# Patient Record
Sex: Male | Born: 1990 | Race: Black or African American | Hispanic: No | Marital: Married | Smoking: Former smoker
Health system: Southern US, Community
[De-identification: ages and names within clinical notes are randomized; demographics above are authoritative.]

## PROBLEM LIST (undated history)

## (undated) DIAGNOSIS — I7771 Dissection of carotid artery: Secondary | ICD-10-CM

## (undated) DIAGNOSIS — I675 Moyamoya disease: Secondary | ICD-10-CM

## (undated) DIAGNOSIS — I639 Cerebral infarction, unspecified: Secondary | ICD-10-CM

## (undated) HISTORY — DX: Dissection of carotid artery: I77.71

## (undated) HISTORY — DX: Cerebral infarction, unspecified: I63.9

## (undated) HISTORY — DX: Moyamoya disease: I67.5

---

## 2013-04-21 DIAGNOSIS — G4730 Sleep apnea, unspecified: Secondary | ICD-10-CM | POA: Insufficient documentation

## 2013-04-21 DIAGNOSIS — A6000 Herpesviral infection of urogenital system, unspecified: Secondary | ICD-10-CM | POA: Insufficient documentation

## 2013-04-21 DIAGNOSIS — Z8619 Personal history of other infectious and parasitic diseases: Secondary | ICD-10-CM | POA: Insufficient documentation

## 2013-12-31 DIAGNOSIS — N342 Other urethritis: Secondary | ICD-10-CM | POA: Insufficient documentation

## 2013-12-31 DIAGNOSIS — R300 Dysuria: Secondary | ICD-10-CM | POA: Insufficient documentation

## 2016-11-28 ENCOUNTER — Encounter (HOSPITAL_COMMUNITY)
Admission: EM | Disposition: A | Discharge status: Home / Self Care | Payer: Self-pay | Source: Home / Self Care | Attending: Internal Medicine

## 2016-11-28 ENCOUNTER — Emergency Department (HOSPITAL_COMMUNITY): Payer: BLUE CROSS/BLUE SHIELD

## 2016-11-28 ENCOUNTER — Emergency Department (HOSPITAL_COMMUNITY): Payer: BLUE CROSS/BLUE SHIELD | Admitting: Certified Registered Nurse Anesthetist

## 2016-11-28 ENCOUNTER — Inpatient Hospital Stay (HOSPITAL_COMMUNITY)
Admission: EM | Admit: 2016-11-28 | Discharge: 2016-12-01 | DRG: 064 | Disposition: A | Discharge status: Home / Self Care | Payer: BLUE CROSS/BLUE SHIELD | Attending: Internal Medicine | Admitting: Internal Medicine

## 2016-11-28 ENCOUNTER — Encounter (HOSPITAL_COMMUNITY): Payer: Self-pay | Admitting: Emergency Medicine

## 2016-11-28 DIAGNOSIS — F17210 Nicotine dependence, cigarettes, uncomplicated: Secondary | ICD-10-CM | POA: Diagnosis present

## 2016-11-28 DIAGNOSIS — I63231 Cerebral infarction due to unspecified occlusion or stenosis of right carotid arteries: Secondary | ICD-10-CM

## 2016-11-28 DIAGNOSIS — R29810 Facial weakness: Secondary | ICD-10-CM | POA: Diagnosis present

## 2016-11-28 DIAGNOSIS — E669 Obesity, unspecified: Secondary | ICD-10-CM | POA: Diagnosis present

## 2016-11-28 DIAGNOSIS — R7303 Prediabetes: Secondary | ICD-10-CM

## 2016-11-28 DIAGNOSIS — R739 Hyperglycemia, unspecified: Secondary | ICD-10-CM | POA: Diagnosis present

## 2016-11-28 DIAGNOSIS — I7771 Dissection of carotid artery: Secondary | ICD-10-CM | POA: Diagnosis present

## 2016-11-28 DIAGNOSIS — I675 Moyamoya disease: Secondary | ICD-10-CM | POA: Diagnosis present

## 2016-11-28 DIAGNOSIS — E559 Vitamin D deficiency, unspecified: Secondary | ICD-10-CM | POA: Insufficient documentation

## 2016-11-28 DIAGNOSIS — Z6833 Body mass index (BMI) 33.0-33.9, adult: Secondary | ICD-10-CM

## 2016-11-28 DIAGNOSIS — E785 Hyperlipidemia, unspecified: Secondary | ICD-10-CM | POA: Diagnosis present

## 2016-11-28 DIAGNOSIS — I2510 Atherosclerotic heart disease of native coronary artery without angina pectoris: Secondary | ICD-10-CM | POA: Diagnosis present

## 2016-11-28 DIAGNOSIS — I639 Cerebral infarction, unspecified: Secondary | ICD-10-CM | POA: Diagnosis present

## 2016-11-28 DIAGNOSIS — I63019 Cerebral infarction due to thrombosis of unspecified vertebral artery: Secondary | ICD-10-CM

## 2016-11-28 DIAGNOSIS — R4701 Aphasia: Secondary | ICD-10-CM | POA: Diagnosis present

## 2016-11-28 DIAGNOSIS — E7801 Familial hypercholesterolemia: Secondary | ICD-10-CM

## 2016-11-28 DIAGNOSIS — R29705 NIHSS score 5: Secondary | ICD-10-CM | POA: Diagnosis present

## 2016-11-28 HISTORY — PX: IR ANGIO VERTEBRAL SEL VERTEBRAL BILAT MOD SED: IMG5369

## 2016-11-28 HISTORY — PX: IR ANGIO INTRA EXTRACRAN SEL COM CAROTID INNOMINATE BILAT MOD SED: IMG5360

## 2016-11-28 HISTORY — PX: RADIOLOGY WITH ANESTHESIA: SHX6223

## 2016-11-28 LAB — APTT: aPTT: 29 seconds (ref 24–36)

## 2016-11-28 LAB — CBC
HEMATOCRIT: 45.4 % (ref 39.0–52.0)
HEMOGLOBIN: 14.9 g/dL (ref 13.0–17.0)
MCH: 30.5 pg (ref 26.0–34.0)
MCHC: 32.8 g/dL (ref 30.0–36.0)
MCV: 93 fL (ref 78.0–100.0)
Platelets: 224 10*3/uL (ref 150–400)
RBC: 4.88 MIL/uL (ref 4.22–5.81)
RDW: 14.2 % (ref 11.5–15.5)
WBC: 6.7 10*3/uL (ref 4.0–10.5)

## 2016-11-28 LAB — URINALYSIS, ROUTINE W REFLEX MICROSCOPIC
Bilirubin Urine: NEGATIVE
GLUCOSE, UA: NEGATIVE mg/dL
Hgb urine dipstick: NEGATIVE
KETONES UR: NEGATIVE mg/dL
LEUKOCYTES UA: NEGATIVE
Nitrite: NEGATIVE
PROTEIN: NEGATIVE mg/dL
Specific Gravity, Urine: 1.026 (ref 1.005–1.030)
pH: 5 (ref 5.0–8.0)

## 2016-11-28 LAB — DIFFERENTIAL
BASOS ABS: 0 10*3/uL (ref 0.0–0.1)
Basophils Relative: 0 %
Eosinophils Absolute: 0 10*3/uL (ref 0.0–0.7)
Eosinophils Relative: 0 %
LYMPHS ABS: 1.9 10*3/uL (ref 0.7–4.0)
LYMPHS PCT: 28 %
Monocytes Absolute: 0.4 10*3/uL (ref 0.1–1.0)
Monocytes Relative: 6 %
NEUTROS ABS: 4.4 10*3/uL (ref 1.7–7.7)
NEUTROS PCT: 66 %

## 2016-11-28 LAB — I-STAT CHEM 8, ED
BUN: 16 mg/dL (ref 6–20)
CHLORIDE: 106 mmol/L (ref 101–111)
Calcium, Ion: 1.14 mmol/L — ABNORMAL LOW (ref 1.15–1.40)
Creatinine, Ser: 1 mg/dL (ref 0.61–1.24)
Glucose, Bld: 93 mg/dL (ref 65–99)
HEMATOCRIT: 45 % (ref 39.0–52.0)
HEMOGLOBIN: 15.3 g/dL (ref 13.0–17.0)
Potassium: 4 mmol/L (ref 3.5–5.1)
Sodium: 140 mmol/L (ref 135–145)
TCO2: 28 mmol/L (ref 0–100)

## 2016-11-28 LAB — COMPREHENSIVE METABOLIC PANEL
ALBUMIN: 4.2 g/dL (ref 3.5–5.0)
ALK PHOS: 71 U/L (ref 38–126)
ALT: 33 U/L (ref 17–63)
AST: 19 U/L (ref 15–41)
Anion gap: 7 (ref 5–15)
BILIRUBIN TOTAL: 0.7 mg/dL (ref 0.3–1.2)
BUN: 12 mg/dL (ref 6–20)
CALCIUM: 9.3 mg/dL (ref 8.9–10.3)
CO2: 26 mmol/L (ref 22–32)
CREATININE: 1.07 mg/dL (ref 0.61–1.24)
Chloride: 108 mmol/L (ref 101–111)
GFR calc Af Amer: >60 mL/min (ref >60)
GFR calc non Af Amer: >60 mL/min (ref >60)
GLUCOSE: 100 mg/dL — AB (ref 65–99)
POTASSIUM: 3.9 mmol/L (ref 3.5–5.1)
Sodium: 141 mmol/L (ref 135–145)
TOTAL PROTEIN: 6.6 g/dL (ref 6.5–8.1)

## 2016-11-28 LAB — RAPID URINE DRUG SCREEN, HOSP PERFORMED
Amphetamines: NOT DETECTED
BARBITURATES: NOT DETECTED
Benzodiazepines: NOT DETECTED
COCAINE: NOT DETECTED
Opiates: NOT DETECTED
TETRAHYDROCANNABINOL: POSITIVE — AB

## 2016-11-28 LAB — C-REACTIVE PROTEIN

## 2016-11-28 LAB — RAPID HIV SCREEN (HIV 1/2 AB+AG)
HIV 1/2 ANTIBODIES: NONREACTIVE
HIV-1 P24 Antigen - HIV24: NONREACTIVE

## 2016-11-28 LAB — I-STAT TROPONIN, ED: Troponin i, poc: 0 ng/mL (ref 0.00–0.08)

## 2016-11-28 LAB — PROTIME-INR
INR: 1
PROTHROMBIN TIME: 13.2 s (ref 11.4–15.2)

## 2016-11-28 LAB — SEDIMENTATION RATE: SED RATE: 4 mm/h (ref 0–16)

## 2016-11-28 LAB — ETHANOL

## 2016-11-28 LAB — CBG MONITORING, ED: Glucose-Capillary: 89 mg/dL (ref 65–99)

## 2016-11-28 SURGERY — RADIOLOGY WITH ANESTHESIA
Anesthesia: Monitor Anesthesia Care

## 2016-11-28 MED ORDER — FENTANYL CITRATE (PF) 100 MCG/2ML IJ SOLN
INTRAMUSCULAR | Status: DC | PRN
  Administered 2016-11-28: 50 ug via INTRAVENOUS

## 2016-11-28 MED ORDER — ASPIRIN EC 81 MG PO TBEC
81.0000 mg | DELAYED_RELEASE_TABLET | Freq: Every day | ORAL | Status: DC
  Administered 2016-11-29 – 2016-12-01 (×3): 81 mg via ORAL
  Filled 2016-11-28 (×3): qty 1

## 2016-11-28 MED ORDER — LIDOCAINE HCL (PF) 1 % IJ SOLN
INTRAMUSCULAR | Status: AC
  Filled 2016-11-28: qty 30

## 2016-11-28 MED ORDER — ASPIRIN 325 MG PO TABS: 325.0000 mg | ORAL_TABLET | Freq: Once | ORAL | Status: DC

## 2016-11-28 MED ORDER — PROPOFOL 500 MG/50ML IV EMUL
INTRAVENOUS | Status: DC | PRN
  Administered 2016-11-28: 10 ug/kg/min via INTRAVENOUS

## 2016-11-28 MED ORDER — SODIUM CHLORIDE 0.9 % IV SOLN
INTRAVENOUS | Status: DC
  Administered 2016-11-28: 19:00:00 via INTRAVENOUS

## 2016-11-28 MED ORDER — LACTATED RINGERS IV SOLN
INTRAVENOUS | Status: DC | PRN
  Administered 2016-11-28: 14:00:00 via INTRAVENOUS

## 2016-11-28 MED ORDER — FENTANYL CITRATE (PF) 100 MCG/2ML IJ SOLN
INTRAMUSCULAR | Status: AC
  Filled 2016-11-28: qty 2

## 2016-11-28 MED ORDER — CLOPIDOGREL BISULFATE 75 MG PO TABS
75.0000 mg | ORAL_TABLET | Freq: Every day | ORAL | Status: DC
  Administered 2016-11-29 – 2016-12-01 (×3): 75 mg via ORAL
  Filled 2016-11-28 (×3): qty 1

## 2016-11-28 MED ORDER — HEPARIN SODIUM (PORCINE) 1000 UNIT/ML IJ SOLN
INTRAMUSCULAR | Status: DC | PRN
  Administered 2016-11-28: 1000 [IU] via INTRAVENOUS

## 2016-11-28 MED ORDER — MIDAZOLAM HCL 5 MG/5ML IJ SOLN
INTRAMUSCULAR | Status: DC | PRN
  Administered 2016-11-28: 2 mg via INTRAVENOUS

## 2016-11-28 MED ORDER — STROKE: EARLY STAGES OF RECOVERY BOOK
Freq: Once | Status: AC
  Administered 2016-11-30: 06:00:00

## 2016-11-28 MED ORDER — ACETAMINOPHEN 325 MG PO TABS
650.0000 mg | ORAL_TABLET | Freq: Four times a day (QID) | ORAL | Status: DC | PRN
  Administered 2016-11-28 – 2016-12-01 (×6): 650 mg via ORAL
  Filled 2016-11-28 (×6): qty 2

## 2016-11-28 MED ORDER — IOPAMIDOL (ISOVUE-300) INJECTION 61%
INTRAVENOUS | Status: AC
  Administered 2016-11-28: 85 mL
  Filled 2016-11-28: qty 300

## 2016-11-28 MED ORDER — IOPAMIDOL (ISOVUE-370) INJECTION 76%
INTRAVENOUS | Status: AC
  Administered 2016-11-28: 50 mL
  Filled 2016-11-28: qty 50

## 2016-11-28 MED ORDER — MIDAZOLAM HCL 2 MG/2ML IJ SOLN
INTRAMUSCULAR | Status: AC
  Filled 2016-11-28: qty 2

## 2016-11-28 MED ORDER — LIDOCAINE HCL (PF) 1 % IJ SOLN
INTRAMUSCULAR | Status: DC | PRN
  Administered 2016-11-28: 10 mL

## 2016-11-28 MED ORDER — FENTANYL CITRATE (PF) 100 MCG/2ML IJ SOLN
25.0000 ug | Freq: Once | INTRAMUSCULAR | Status: AC
Start: 2016-11-28 — End: 2016-11-28
  Administered 2016-11-28: 25 ug via INTRAVENOUS

## 2016-11-28 NOTE — Anesthesia Postprocedure Evaluation (Signed)
Anesthesia Post Note  Patient: [place]  Procedure(s) Performed: Procedure(s) (LRB): RADIOLOGY WITH ANESTHESIA (N/A)     Patient location during evaluation: PACU Anesthesia Type: MAC Level of consciousness: awake and alert Pain management: pain level controlled Vital Signs Assessment: post-procedure vital signs reviewed and stable Respiratory status: spontaneous breathing, nonlabored ventilation, respiratory function stable and patient connected to nasal cannula oxygen Cardiovascular status: stable and blood pressure returned to baseline Anesthetic complications: no    Last Vitals:  Vitals:   11/28/16 1555 11/28/16 1615  BP: 122/69 112/68  Pulse: (!) 45 (!) 48  Resp: (!) 22 16  Temp:      Last Pain:  Vitals:   11/28/16 1615  TempSrc:   PainSc: 0-No pain                 Effie Berkshire

## 2016-11-28 NOTE — H&P (Signed)
Date: 11/28/2016               Patient Name:  Manuel Boyer MRN: [id number]  DOB: Nov 15, 1990 Age / Sex: 26 y.o., male   PCP: Patient, No Pcp Per         Medical Service: Internal Medicine Teaching Service         Attending Physician: Dr. Lucious Groves, DO    First Contact: Dr. Hetty Ely Pager: [phone]  Second Contact: Dr. Juleen China Pager: [phone]       After Hours (After 5p/  First Contact Pager: [phone]  weekends / holidays): Second Contact Pager: [phone]   Chief Complaint: Pain in right arm and left hand  History of Present Illness: PMH tobacco use, hyperglycemia, genital herpes, and vitamin D deficiency who presents with pain in his right arm and left hand, slurred speech and right hand weakness which began last Wednesday and worsened over the last 2 days. He denies any other weakness, changes in vision or hearing, numbness or tingling, neck pain or stiffness. Yesterday he also developed a headache over the left side of his head which is not associated with photophobia or phonophobia and did not relieve with aleve. Never had any symptoms like this before.  In the ED he was found to have heart rate in the 50s, RR 20s, SBP 110s, SpO2 98% on room air. Labs revealed a glucose100, white count 6.7, glucose 100, Hgb 15, point-of-care troponin negative, blood alcohol level undetected, UDS positive for THC, urinalysis negative for leukocytes, nitrites or bacteria.   Meds:  He's not on any medications at home.   Allergies: Allergies as of 11/28/2016  . (No Known Allergies)   History reviewed. No pertinent past medical history.  Family History: Brother had a brain hemorrhage and a young age   Social History: He has smoked 3 PPD for the past 10 years. Denies alcohol or cocaine use. Does report he uses marijuana. He works as a Probation officer. Reports he's been otherwise healthy not taken any chronic medications.   Review of Systems: A complete ROS was negative except as per  HPI.  Physical Exam: Blood pressure 108/71, pulse (!) 44, temperature 97 F (36.1 C), resp. rate 20, height '5\' 10"'  (1.778 m), weight 236 lb (107 kg), SpO2 98 %. Physical Exam  Constitutional: He is oriented to person, place, and time and well-developed, well-nourished, and in no distress. No distress.  HENT:  Head: Normocephalic and atraumatic.  Eyes: Conjunctivae and EOM are normal. Pupils are equal, round, and reactive to light. Right eye exhibits no discharge. Left eye exhibits no discharge. No scleral icterus.  Cardiovascular: Normal rate and regular rhythm.   No murmur heard. Pulmonary/Chest: Effort normal. No respiratory distress.  No wheezes or rales over anterior lung fields  Abdominal: Soft. Bowel sounds are normal. He exhibits no distension. There is no tenderness. There is no guarding.  Neurological: He is alert and oriented to person, place, and time. A cranial nerve deficit is present.  Right-sided facial droop, word finding difficulties. Right hand strength 1 out of 5. Bilateral upper arm strength equal and 5 /5. Left hand strength 5/ 5. Bilateral Doppler showed extremities 5/5. Finger to nose limited by right hand weakness but appeared normal  Skin: Skin is warm and dry. He is not diaphoretic.   EKG: Personal review the EKG shows normal sinus rhythm with T-wave inversion in V1  CXR: Personal review of the chest x-ray shows no acute cardiopulmonary  disease.  CT angio head and neck echodensity in the left frontal lobe consistent with subacute infarct. 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 Occlusion of the supraclinoid segment of the internal carotid with multiple collaterals. Diffuse irregularity of anterior, middle, and posterior cerebral artery branches.   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 He was taken for conventional angiography of the internal carotid occlusion, unfortunately this showed there was no ability to do intervention at this time. Lipid panel in care everywhere from 04/2015 shows triglycerides 406, total cholesterol 183, and HDL 31.  Follow-up hemoglobin A1c and fasting morning glucose for risk factor modification  F/u sickle cell panel  F/U CRP, ESR, ANA, C and P ANCA PT OT SLP consult  -neuro checks and tele monitoring   History of vitamin D deficiency Vitamin D level in November 2016 was 12.7 he is not currently on supplementation for this and likely still has a low vitamin D level. - Follow-up vitamin D  Dispo: Admit patient to Inpatient with expected length of stay greater than 2 midnights.  Signed: Ledell Noss, MD 11/28/2016, 5:46 PM  Pager: [phone]

## 2016-11-28 NOTE — Progress Notes (Signed)
Paged Katrinka BlazingSmith, [place] [facility] he states "neurology is not admitting this patient the hospitalists will be admitting and Dr Silverio LayYao is looking for someone who would be willing to take the patient. The patient will have to go back to the ED." Informed anesthesia and RN in procedure room. [facility] for assistance with admitting pt.

## 2016-11-28 NOTE — ED Triage Notes (Signed)
Per ems pt from home, pt LSn Thursday, c/o slurred speech and R arm weekness. Pt is AAOX4, very slow to get words out, R arm is tremoring. Pt c/o R arm pain. VSS

## 2016-11-28 NOTE — Progress Notes (Signed)
Removed pt wedding band from left ring finger, placed in biohazard bag and gave to pt wife.

## 2016-11-28 NOTE — ED Notes (Signed)
Neurology at bedside.

## 2016-11-28 NOTE — Code Documentation (Signed)
26 y.o. male with no significant PMHx who states about 5 days ago started to have some confusion. This confusion progressed over the following days and began to have expressive aphasia along with dysarthria that began at 1900 on 6/25. [facility] evaluation of these symptoms. EDP assessed patient and noted patient to have right sided facial droop and right sided weakness. Code stroke not called d/t patient being out of the window. CTH and CTA ordered.   On assessment, patient with mild dysarthria, expressive aphasia, RUE weakness and slight right facial droop. NIHSS 5. See EMR for NIHSS.  Radiologist reporting patient likely had ICA dissection with complete M1 occlusion causing subacute L frontal stroke on CT currently. Neurologist consulted, who reviewed CT and CTA. Neurologist stated to call code stroke despite patient being out of the window for intervention. Neurologist states that occlusion may be new and he is a candidate for IR intervention potentially. Neurologist and IR MD discussed case and decided to proceed with a diagnostic cath angio. Consent obtained from wife and patient taken to IR by ED RN and SRN.

## 2016-11-28 NOTE — Anesthesia Procedure Notes (Signed)
Procedure Name: MAC Date/Time: 11/28/2016 1:45 PM Performed by: Verdie Drown Pre-anesthesia Checklist: Patient identified, Emergency Drugs available and Suction available Patient Re-evaluated:Patient Re-evaluated prior to inductionOxygen Delivery Method: Simple face mask

## 2016-11-28 NOTE — Anesthesia Preprocedure Evaluation (Addendum)
Anesthesia Evaluation  Patient identified by MRN, date of birth, ID band Patient awake    Reviewed: Allergy & Precautions, NPO status , Patient's Chart, lab work & pertinent test results  Airway Mallampati: II  TM Distance: >3 FB Neck ROM: Full    Dental  (+) Teeth Intact, Dental Advisory Given   Pulmonary Current Smoker,    breath sounds clear to auscultation       Cardiovascular  Rhythm:Regular Rate:Normal     Neuro/Psych    GI/Hepatic   Endo/Other    Renal/GU      Musculoskeletal   Abdominal   Peds  Hematology   Anesthesia Other Findings Slurred speech, difficulty with word finding. R. Arm weakness.  Reproductive/Obstetrics                            Anesthesia Physical Anesthesia Plan  ASA: IV and emergent  Anesthesia Plan: MAC and General   Post-op Pain Management:    Induction: Intravenous  PONV Risk Score and Plan:   Airway Management Planned: Natural Airway and Simple Face Mask  Additional Equipment:   Intra-op Plan:   Post-operative Plan:   Informed Consent: I have reviewed the patients History and Physical, chart, labs and discussed the procedure including the risks, benefits and alternatives for the proposed anesthesia with the patient or authorized representative who has indicated his/her understanding and acceptance.     Plan Discussed with: CRNA and Anesthesiologist  Anesthesia Plan Comments:         Anesthesia Quick Evaluation

## 2016-11-28 NOTE — Procedures (Signed)
S/P  4 vessel cerebral arteriogram. RT CFA aproach. Findings. 1.occluded RT ICA at the level of ophthalmic artery. 2.Occluded Lt ICA at the bulb. 3.Extensive collaterals from bilateral anterior choroidals,Pcoms and P3s bilaterally   reconstituting ACAs and MCA distributions.

## 2016-11-28 NOTE — Transfer of Care (Signed)
Immediate Anesthesia Transfer of Care Note  Patient: Manuel Boyer  Procedure(s) Performed: Procedure(s): RADIOLOGY WITH ANESTHESIA (N/A)  Patient Location: PACU  Anesthesia Type:MAC  Level of Consciousness: awake, alert , oriented and patient cooperative  Airway & Oxygen Therapy: Patient Spontanous Breathing  Post-op Assessment: Report given to RN and Post -op Vital signs reviewed and stable  Post vital signs: Reviewed and stable  Last Vitals:  Vitals:   11/28/16 1515 11/28/16 1526  BP: (!) 105/59 118/78  Pulse: (!) 50 (!) 44  Resp: 19 (!) 21  Temp: 36.1 C     Last Pain:  Vitals:   11/28/16 1526  TempSrc:   PainSc: 0-No pain         Complications: No apparent anesthesia complications   Neuro status reviewed with nurse at bedside.  Right sided facial droop and weakness in the right hand remains present.  PERRLA Denies any pain or discomforts.

## 2016-11-28 NOTE — Consult Note (Addendum)
Requesting Physician: Dr. Darl Householder    Chief Complaint: left ICA dissection  History obtained from:  Patient     HPI:                                                                                                                                         Manuel Boyer is an 26 y.o. male with no significant past medical history. Approximately 5 days ago patient noticed some neck discomfort with slurred speech and some right arm weakness. Apparently last night at 2000 hrs. patient was noted it worse and has significant word finding difficulties and right arm weakness. 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 There is also noted hypodensity in the left frontal lobe most compatible with a subacute infarct. The occlusion the left carotid internal artery with a taper lumen as noted above was consistent with a dissection. 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 As far as familypatient does not have sickle cell. 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 Due to the multiple arterial abnormalities secondary to moyamoya and the leptomeningeal collaterals patient is going to be sent to interventional radiology to get a conventional angiogram to further evaluate if this isn't M1 occlusion and if this is possible to intervene on.  Date last known well: Date: 11/23/2016 Time last known well: Time: 20:00 tPA Given: No: out of window  History reviewed. No pertinent past medical history.  History reviewed. No pertinent surgical history.  No family history on file. Social History:  reports that he has been smoking.  He does not have any smokeless tobacco history on file. He reports that he uses drugs, including Marijuana. He reports that he does not drink alcohol.  Allergies: No Known Allergies  Medications:                                                                                                                            Current Facility-Administered Medications  Medication Dose Route Frequency Provider Last Rate Last Dose  . fentaNYL (SUBLIMAZE) 100 MCG/2ML injection           . iopamidol (ISOVUE-300) 61 % injection           .  lidocaine (PF) (XYLOCAINE) 1 % injection            No current outpatient prescriptions on file.   Facility-Administered Medications Ordered in Other Encounters  Medication Dose Route Frequency Provider Last Rate Last Dose  . lactated ringers infusion    Continuous PRN Judeth Cornfield T, CRNA         ROS:                                                                                                                                       History obtained from the patient  General ROS: negative for - chills, fatigue, fever, night sweats, weight gain or weight loss Psychological ROS: negative for - behavioral disorder, hallucinations, memory difficulties, mood swings or suicidal ideation Ophthalmic ROS: negative for - blurry vision, double vision, eye pain or loss of vision ENT ROS: negative for - epistaxis, nasal discharge, oral lesions, sore throat, tinnitus or vertigo Allergy and Immunology ROS: negative for - hives or itchy/watery eyes Hematological and Lymphatic ROS: negative for - bleeding problems, bruising or swollen lymph nodes Endocrine ROS: negative for - galactorrhea, hair pattern changes, polydipsia/polyuria or temperature intolerance Respiratory ROS: negative for - cough, hemoptysis, shortness of breath or wheezing Cardiovascular ROS: negative for - chest pain, dyspnea on exertion, edema or irregular heartbeat Gastrointestinal ROS: negative for - abdominal pain, diarrhea, hematemesis, nausea/vomiting or stool incontinence Genito-Urinary ROS: negative for - dysuria, hematuria, incontinence or urinary frequency/urgency Musculoskeletal ROS: negative for -  joint swelling or muscular weakness Neurological ROS: as noted in HPI Dermatological ROS: negative for rash and skin lesion changes  Neurologic Examination:                                                                                                      Blood pressure 122/74, pulse 66, temperature 99 F (37.2 C), resp. rate 16, height '5\' 10"'  (1.778 m), weight 107 kg (236 lb), SpO2 100 %.  HEENT-  Normocephalic, no lesions, without obvious abnormality.  Normal external eye and conjunctiva.  Normal TM's bilaterally.  Normal auditory canals and external ears. Normal external nose, mucus membranes and septum.  Normal pharynx. Cardiovascular- S1, S2 normal, pulses palpable throughout   Lungs- chest clear, no wheezing, rales, normal symmetric air entry Abdomen- normal findings: bowel sounds normal Extremities- no edema Lymph-no adenopathy palpable Musculoskeletal-no joint tenderness, deformity or swelling Skin-warm and dry, no hyperpigmentation, vitiligo, or suspicious lesions  Neurological Examination Mental Status: Alert, oriented, thought content appropriate.  Speech shows a expressive aphasia.  Able to follow 3 step commands without difficulty. Cranial Nerves: II: ; Visual fields grossly normal,  III,IV, VI: ptosis not present, extra-ocular motions intact bilaterally, pupils equal, round, reactive to light and accommodation V,VII: smile symmetric on the right with mild right facial droop at rest, facial light touch sensation normal bilaterally VIII: hearing normal bilaterally IX,X: uvula rises symmetrically XI: bilateral shoulder shrug XII: midline tongue extension Motor: Right : Upper extremity   3/5    Left:     Upper extremity   5/5  Lower extremity   5/5     Lower extremity   5/5 Tone and bulk:normal tone throughout; no atrophy noted Sensory: Pinprick and light touch intact throughout, bilaterally Deep Tendon Reflexes: 2+ and symmetric throughout Plantars: Right:  downgoing   Left: downgoing Cerebellar: normal finger-to-nose on the left, normal rapid alternating movements and normal heel-to-shin test Gait: Not tested       Lab Results: Basic Metabolic Panel:  Recent Labs Lab 11/28/16 1007 11/28/16 1019  NA 141 140  K 3.9 4.0  CL 108 106  CO2 26  --   GLUCOSE 100* 93  BUN 12 16  CREATININE 1.07 1.00  CALCIUM 9.3  --     Liver Function Tests:  Recent Labs Lab 11/28/16 1007  AST 19  ALT 33  ALKPHOS 71  BILITOT 0.7  PROT 6.6  ALBUMIN 4.2   No results for input(s): LIPASE, AMYLASE in the last 168 hours. No results for input(s): AMMONIA in the last 168 hours.  CBC:  Recent Labs Lab 11/28/16 1007 11/28/16 1019  WBC 6.7  --   NEUTROABS 4.4  --   HGB 14.9 15.3  HCT 45.4 45.0  MCV 93.0  --   PLT 224  --     Cardiac Enzymes: No results for input(s): CKTOTAL, CKMB, CKMBINDEX, TROPONINI in the last 168 hours.  Lipid Panel: No results for input(s): CHOL, TRIG, HDL, CHOLHDL, VLDL, LDLCALC in the last 168 hours.  CBG:  Recent Labs Lab 11/28/16 1018  GLUCAP 89    Microbiology: No results found for this or any previous visit.  Coagulation Studies:  Recent Labs  11/28/16 1050  LABPROT 13.2  INR 1.00    Imaging: Ct Angio Head W Or Wo Contrast  Result Date: 11/28/2016 CLINICAL DATA:  Slurred speech and left arm weakness started 5 days ago EXAM: CT ANGIOGRAPHY HEAD AND NECK TECHNIQUE: Multidetector CT imaging of the head and neck was performed using the standard protocol during bolus administration of intravenous contrast. Multiplanar CT image reconstructions and MIPs were obtained to evaluate the vascular anatomy. Carotid stenosis measurements (when applicable) are obtained utilizing NASCET criteria, using the distal internal carotid diameter as the denominator. CONTRAST:  50 mL Isovue 370 IV COMPARISON:  None. FINDINGS: CT HEAD FINDINGS Brain: Hypodensity in the left frontal lobe involving cortex and white matter  most consistent with subacute infarct. Low-density in the left insula most compatible with acute infarct. There is extension into the left internal capsule anteriorly and left putamen. Negative for hemorrhage. No significant mass-effect or midline shift. Ventricle size normal. Vascular: Negative for hyperdense vessel. Skull: Negative Sinuses: Mucosal edema in the ethmoid sinuses bilaterally. Orbits: Normal orbit. Review of the MIP images confirms the above findings CTA NECK FINDINGS Aortic arch: Normal aortic arch and proximal great vessels. Right carotid system: Normal right carotid system without stenosis or dissection. Left carotid system: Left common carotid artery normal. Occlusion of the proximal left  internal carotid artery with a tapering residual lumen, suspicious for dissection. External carotid artery patent. Left internal carotid artery is patent through the cavernous segment. Vertebral arteries: Both vertebral arteries are normal Skeleton: Negative Other neck: Negative for mass or adenopathy Upper chest: Negative Review of the MIP images confirms the above findings CTA HEAD FINDINGS Anterior circulation: Right cavernous carotid is normal. There is occlusion of the terminal right internal carotid artery. There are multiple collateral small vessels bridging the distal ICA and MCA and ACA compatible with moyamoya phenomena. Diffuse irregularity throughout right anterior and middle cerebral artery branches. Left internal carotid artery is occluded through the cavernous segment and supraclinoid segment. Multiple small collateral arteries are seen in the left supraclinoid region similar to that seen on the right. Small irregular left M1 segment. These collaterals supply flow to the left anterior and middle cerebral arteries which are patent. There is diffuse irregularity of anterior middle cerebral arteries on the left similar to that seen on the right. Posterior circulation: Both vertebral arteries patent to  the basilar. Basilar widely patent. PICA patent. Fetal origin of the right posterior cerebral artery. Mild irregularity of the posterior cerebral arteries bilaterally without occlusion. Venous sinuses: Not well visualized due to timing of the scan. Partial filling of the venous sinuses suggesting patency Anatomic variants: None Delayed phase: Normal enhancement. Review of the MIP images confirms the above findings IMPRESSION: Hypodensity left frontal lobe most compatible with subacute infarct. This extends into the left basal ganglia. Negative for hemorrhage Occlusion proximal left internal carotid artery with a tapered lumen consistent with dissection. 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 There is diffuse irregularity of anterior, middle, posterior cerebral artery branches. Differential includes collagen vascular disease, lupus, sickle cell, vasculitis, and other vascular disorders These results were called by telephone at the time of interpretation on 11/28/2016 at 1:17 pm to Dr. Shirlyn Goltz , who verbally acknowledged these results. Electronically Signed   By: Franchot Gallo M.D.   On: 11/28/2016 13:19   Ct Angio Neck W And/or Wo Contrast  Result Date: 11/28/2016 CLINICAL DATA:  Slurred speech and left arm weakness started 5 days ago EXAM: CT ANGIOGRAPHY HEAD AND NECK TECHNIQUE: Multidetector CT imaging of the head and neck was performed using the standard protocol during bolus administration of intravenous contrast. Multiplanar CT image reconstructions and MIPs were obtained to evaluate the vascular anatomy. Carotid stenosis measurements (when applicable) are obtained utilizing NASCET criteria, using the distal internal carotid diameter as the denominator. CONTRAST:  50 mL Isovue 370 IV COMPARISON:  None. FINDINGS: CT HEAD FINDINGS Brain: Hypodensity in the left frontal lobe involving cortex and white matter  most consistent with subacute infarct. Low-density in the left insula most compatible with acute infarct. There is extension into the left internal capsule anteriorly and left putamen. Negative for hemorrhage. No significant mass-effect or midline shift. Ventricle size normal. Vascular: Negative for hyperdense vessel. Skull: Negative Sinuses: Mucosal edema in the ethmoid sinuses bilaterally. Orbits: Normal orbit. Review of the MIP images confirms the above findings CTA NECK FINDINGS Aortic arch: Normal aortic arch and proximal great vessels. Right carotid system: Normal right carotid system without stenosis or dissection. Left carotid system: Left common carotid artery normal. Occlusion of the proximal left internal carotid artery with a tapering residual lumen, suspicious for dissection. External carotid artery patent. Left internal carotid artery is patent through the cavernous segment. Vertebral arteries: Both vertebral arteries are normal Skeleton: Negative  Other neck: Negative for mass or adenopathy Upper chest: Negative Review of the MIP images confirms the above findings CTA HEAD FINDINGS Anterior circulation: Right cavernous carotid is normal. There is occlusion of the terminal right internal carotid artery. There are multiple collateral small vessels bridging the distal ICA and MCA and ACA compatible with moyamoya phenomena. Diffuse irregularity throughout right anterior and middle cerebral artery branches. Left internal carotid artery is occluded through the cavernous segment and supraclinoid segment. Multiple small collateral arteries are seen in the left supraclinoid region similar to that seen on the right. Small irregular left M1 segment. These collaterals supply flow to the left anterior and middle cerebral arteries which are patent. There is diffuse irregularity of anterior middle cerebral arteries on the left similar to that seen on the right. Posterior circulation: Both vertebral arteries patent to  the basilar. Basilar widely patent. PICA patent. Fetal origin of the right posterior cerebral artery. Mild irregularity of the posterior cerebral arteries bilaterally without occlusion. Venous sinuses: Not well visualized due to timing of the scan. Partial filling of the venous sinuses suggesting patency Anatomic variants: None Delayed phase: Normal enhancement. Review of the MIP images confirms the above findings IMPRESSION: Hypodensity left frontal lobe most compatible with subacute infarct. This extends into the left basal ganglia. Negative for hemorrhage Occlusion proximal left internal carotid artery with a tapered lumen consistent with dissection. 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 There is diffuse irregularity of anterior, middle, posterior cerebral artery branches. Differential includes collagen vascular disease, lupus, sickle cell, vasculitis, and other vascular disorders These results were called by telephone at the time of interpretation on 11/28/2016 at 1:17 pm to Dr. Shirlyn Goltz , who verbally acknowledged these results. Electronically Signed   By: Franchot Gallo M.D.   On: 11/28/2016 13:19   Dg Chest Port 1 View  Result Date: 11/28/2016 CLINICAL DATA:  26 year old male with altered mental status. Right-sided weakness. Initial encounter. EXAM: PORTABLE CHEST 1 VIEW COMPARISON:  None. FINDINGS: Cardiomegaly. Central pulmonary vascular prominence without pulmonary edema. No infiltrate or pneumothorax. Probable bone island left humeral head. IMPRESSION: Cardiomegaly. No infiltrate or pulmonary edema. Electronically Signed   By: Genia Del M.D.   On: 11/28/2016 10:26       Assessment and plan discussed with with attending physician and they are in agreement.    Etta Quill PA-C [facility] [phone]  11/28/2016, 1:34 PM   Assessment: 26 y.o. male presenting to the hospital  with worsening symptoms of right arm weakness and expressive aphasia with a 5 day history of slurred speech and right arm weakness. CTA shows a left internal carotid artery dissection with a possible left M1 occlusion. Patient was immediately sent to interventional radiology to confirm if this is a M1 occlusion that can be intervened on as he has significant collaterals consistent with moyamoya. Conventional angiogram showed there was no ability of doing intervention at this time. ICA was fully occluded and no inication to open his ICA.   Stroke Risk Factors - none   Recommend: 1. HgbA1c, fasting lipid panel, sickle cell panel, CRP and ESR 2. PT consult, OT consult, Speech consult 3. Echocardiogram 4. Prophylactic therapy-dual antiplatelet therapy 5. Risk factor modification 6. Telemetry monitoring 7. Frequent neuro checks 8 NPO until passes stroke swallow screen 11 please page stroke NP  Or  PA  Or MD from 8am -4 pm  as this patient from this time will be  followed by the stroke.   You can look them up on [email or web address]  Password [email or web address]

## 2016-11-28 NOTE — ED Notes (Signed)
Pt to CT

## 2016-11-28 NOTE — ED Provider Notes (Signed)
[facility]-EMERGENCY DEPT Provider Note   CSN: [id number] Arrival date & time: 11/28/16  4098     History   Chief Complaint Chief Complaint  Patient presents with  . Aphasia    HPI Manuel Boyer is a 26 y.o. male otherwise healthy here presenting with trouble speaking, slurred speech, right facial droop and right arm weakness. Patient lives at home with his wife and kids and has been having slurred speech for the last 5 days. He states that it was worse when it started and slowly got better. He states that sometimes he has trouble coming out with worse as well. He also noticed some right facial droop as well but denies trouble with swallowing. Patient also noticed some weakness on the right arm as well but denies trouble walking or leg weakness. Patient does admit to marijuana use but denies alcohol or other drug use. Patient never had a history of stroke and no history of sickle cell. Patient states that he is otherwise healthy and not taking any meds right now. Patient's mom was talking to him on the phone today and she noticed that his speech and slurred and called EMS today.   The history is provided by the patient.    History reviewed. No pertinent past medical history.  There are no active problems to display for this patient.   History reviewed. No pertinent surgical history.     Home Medications    Prior to Admission medications   Not on File    Family History No family history on file.  Social History Social History  Substance Use Topics  . Smoking status: Current Some Day Smoker  . Smokeless tobacco: Not on file  . Alcohol use No     Allergies   Patient has no known allergies.   Review of Systems Review of Systems  Neurological: Positive for speech difficulty and weakness.  All other systems reviewed and are negative.    Physical Exam Updated Vital Signs BP 114/60   Pulse (!) 49   Temp 99 F (37.2 C)   Resp 19   Ht 5\' 10"  (1.778 m)   Wt 107 kg  (236 lb)   SpO2 96%   BMI 33.86 kg/m   Physical Exam  Constitutional:  Slurred speech, some expressive aphasia   HENT:  Head: Normocephalic.  Mouth/Throat: Oropharynx is clear and moist.  Eyes: Conjunctivae and EOM are normal. Pupils are equal, round, and reactive to light.  Neck: Normal range of motion. Neck supple.  Cardiovascular: Normal rate, regular rhythm and normal heart sounds.   Pulmonary/Chest: Effort normal and breath sounds normal. No respiratory distress. He has no wheezes. He has no rales.  Abdominal: Soft. Bowel sounds are normal. He exhibits no distension. There is no tenderness. There is no guarding.  Musculoskeletal: Normal range of motion.  Neurological: He is alert.  R facial droop. Strength 3/5 R arm, 5/5 L arm. 5/5 bilateral legs. Abnormal finger to nose R side   Skin: Skin is warm.  Psychiatric: He has a normal mood and affect.  Nursing note and vitals reviewed.    ED Treatments / Results  Labs (all labs ordered are listed, but only abnormal results are displayed) Labs Reviewed  COMPREHENSIVE METABOLIC PANEL - Abnormal; Notable for the following:       Result Value   Glucose, Bld 100 (*)    All other components within normal limits  RAPID URINE DRUG SCREEN, HOSP PERFORMED - Abnormal; Notable for the following:  Tetrahydrocannabinol POSITIVE (*)    All other components within normal limits  I-STAT CHEM 8, ED - Abnormal; Notable for the following:    Calcium, Ion 1.14 (*)    All other components within normal limits  ETHANOL  CBC  DIFFERENTIAL  URINALYSIS, ROUTINE W REFLEX MICROSCOPIC  PROTIME-INR  APTT  RAPID HIV SCREEN (HIV 1/2 AB+AG)  I-STAT TROPOININ, ED  CBG MONITORING, ED    EKG  EKG Interpretation  Date/Time:  Tuesday November 28 2016 10:01:52 EDT Ventricular Rate:  52 PR Interval:    QRS Duration: 110 QT Interval:  410 QTC Calculation: 382 R Axis:   50 Text Interpretation:  Sinus rhythm No previous ECGs available Confirmed by Richardean Canal ([id number]) on 11/28/2016 10:11:10 AM       Radiology Dg Chest Port 1 View  Result Date: 11/28/2016 CLINICAL DATA:  26 year old male with altered mental status. Right-sided weakness. Initial encounter. EXAM: PORTABLE CHEST 1 VIEW COMPARISON:  None. FINDINGS: Cardiomegaly. Central pulmonary vascular prominence without pulmonary edema. No infiltrate or pneumothorax. Probable bone island left humeral head. IMPRESSION: Cardiomegaly. No infiltrate or pulmonary edema. Electronically Signed   By: Lacy Duverney M.D.   On: 11/28/2016 10:26    Procedures Procedures (including critical care time)  CRITICAL CARE Performed by: Richardean Canal   Total critical care time: 30 minutes  Critical care time was exclusive of separately billable procedures and treating other patients.  Critical care was necessary to treat or prevent imminent or life-threatening deterioration.  Critical care was time spent personally by me on the following activities: development of treatment plan with patient and/or surrogate as well as nursing, discussions with consultants, evaluation of patient's response to treatment, examination of patient, obtaining history from patient or surrogate, ordering and performing treatments and interventions, ordering and review of laboratory studies, ordering and review of radiographic studies, pulse oximetry and re-evaluation of patient's condition.   Medications Ordered in ED Medications  iopamidol (ISOVUE-370) 76 % injection (50 mLs  Contrast Given 11/28/16 1226)     Initial Impression / Assessment and Plan / ED Course  I have reviewed the triage vital signs and the nursing notes.  Pertinent labs & imaging results that were available during my care of the patient were reviewed by me and considered in my medical decision making (see chart for details).     Manuel Boyer is a 26 y.o. male here with R facial droop, R side weakness for 5 days. Not acute onset and initial NIH is 5.  Outside window for code stroke currently but has significant deficit. No stroke risk factors that I know of. Will get CT head, CT angio head/neck to r/o stroke vs bleed vs dissection.   1:13 PM Labs and xrays unremarkable. CT angio done. I called radiology and discussed with Dr. Chestine Spore. He states that he likely had ICA dissection with complete M1 occlusion causing subacute L frontal stroke on CT currently. I called Dr. Cherylynn Ridges from neurology, who reviewed CT and CTA. He wants stat CT perfusion study. He wants to activate code stroke. I told him that symptoms for several days already but he states that occlusion may be new and he is a candidate for IR intervention potentially. Will recalculate NIH.   2:52 PM Patient went to IR. No interventions per neuro. They recommend full dose ASA 325 mg daily. They will follow patient. They recommend sickle cell screen, HIV. Internal medicine teaching service to admit for monitoring and further workup for dissection.  Final Clinical Impressions(s) / ED Diagnoses   Final diagnoses:  None    New Prescriptions New Prescriptions   No medications on file     Charlynne PanderYao, David Hsienta, MD 11/28/16 1453

## 2016-11-29 ENCOUNTER — Encounter (HOSPITAL_COMMUNITY): Payer: Self-pay | Admitting: Interventional Radiology

## 2016-11-29 LAB — BASIC METABOLIC PANEL
Anion gap: 7 (ref 5–15)
BUN: 8 mg/dL (ref 6–20)
CALCIUM: 8.7 mg/dL — AB (ref 8.9–10.3)
CO2: 24 mmol/L (ref 22–32)
CREATININE: 0.96 mg/dL (ref 0.61–1.24)
Chloride: 108 mmol/L (ref 101–111)
GFR calc Af Amer: >60 mL/min (ref >60)
GLUCOSE: 97 mg/dL (ref 65–99)
POTASSIUM: 3.8 mmol/L (ref 3.5–5.1)
Sodium: 139 mmol/L (ref 135–145)

## 2016-11-29 LAB — LIPID PANEL
CHOL/HDL RATIO: 5.8 ratio
Cholesterol: 145 mg/dL (ref 0–200)
HDL: 25 mg/dL — AB (ref >40)
LDL CALC: 105 mg/dL — AB (ref 0–99)
TRIGLYCERIDES: 74 mg/dL (ref <150)
VLDL: 15 mg/dL (ref 0–40)

## 2016-11-29 LAB — CBC
HEMATOCRIT: 42.5 % (ref 39.0–52.0)
Hemoglobin: 13.8 g/dL (ref 13.0–17.0)
MCH: 30.3 pg (ref 26.0–34.0)
MCHC: 32.5 g/dL (ref 30.0–36.0)
MCV: 93.4 fL (ref 78.0–100.0)
Platelets: 215 10*3/uL (ref 150–400)
RBC: 4.55 MIL/uL (ref 4.22–5.81)
RDW: 14.3 % (ref 11.5–15.5)
WBC: 6.4 10*3/uL (ref 4.0–10.5)

## 2016-11-29 MED ORDER — ENOXAPARIN SODIUM 40 MG/0.4ML ~~LOC~~ SOLN
40.0000 mg | SUBCUTANEOUS | Status: DC
  Administered 2016-11-29 – 2016-11-30 (×2): 40 mg via SUBCUTANEOUS
  Filled 2016-11-29 (×2): qty 0.4

## 2016-11-29 MED ORDER — PRAVASTATIN SODIUM 40 MG PO TABS
80.0000 mg | ORAL_TABLET | Freq: Every day | ORAL | Status: DC
  Administered 2016-11-29: 80 mg via ORAL
  Filled 2016-11-29 (×2): qty 2

## 2016-11-29 NOTE — Evaluation (Signed)
Occupational Therapy Evaluation Patient Details Name: Manuel Boyer MRN: [id number] DOB: April 25, 1991 Today's Date: 11/29/2016    History of Present Illness Pt is a 26 y/o male admitted secondary to slurred speech and R UE weakness. 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 PMH including but not limited to tobacco use and hyperglycemia.    Clinical Impression   PTA, pt was independent with ADL and functional mobility and was working full time as a Surveyor, mining. He currently requires mod assist for UB dressing tasks, min assist for LB dressing tasks, and mod assist for grooming tasks. Pt presents with decreased R UE strength and coordination with elbow/wrist/hand most greatly affected. Pt additionally demonstrates difficulty with problem solving skills although complete cognitive assessment limited by communication deficits. Feel pt would best benefit from neuro-outpatient OT services in order to maximize functional use of R UE and improve independence with ADL, IADL, and work related tasks. Initiated HEP with details listed below. OT will continue to follow while admitted.     Follow Up Recommendations  Outpatient OT;Supervision/Assistance - 24 hour    Equipment Recommendations  None recommended by OT    Recommendations for Other Services       Precautions / Restrictions Restrictions Weight Bearing Restrictions: No      Mobility Bed Mobility Overal bed mobility: Modified Independent                Transfers Overall transfer level: Needs assistance Equipment used: None Transfers: Sit to/from Stand Sit to Stand: Supervision         General transfer comment: for safety    Balance Overall balance assessment: Needs assistance Sitting-balance support: Feet supported;No upper extremity supported Sitting balance-Leahy Scale: Normal     Standing balance support: During functional activity;No upper extremity supported Standing balance-Leahy Scale: Good Standing  balance comment: Supervision for safety.                            ADL either performed or assessed with clinical judgement   ADL Overall ADL's : Needs assistance/impaired Eating/Feeding: Set up;Sitting Eating/Feeding Details (indicate cue type and reason): With L hand; unable with R hand Grooming: Standing;Moderate assistance   Upper Body Bathing: Sitting;Minimal assistance   Lower Body Bathing: Sit to/from stand;Minimal assistance   Upper Body Dressing : Moderate assistance;Sitting   Lower Body Dressing: Sit to/from stand;Minimal assistance   Toilet Transfer: Supervision/safety;Ambulation   Toileting- Clothing Manipulation and Hygiene: Minimal assistance;Sit to/from stand       Functional mobility during ADLs: Supervision/safety General ADL Comments: Limited by R UE weakness and decreased coordination.      Vision Patient Visual Report: No change from baseline Vision Assessment?: Yes Eye Alignment: Within Functional Limits Ocular Range of Motion: Within Functional Limits Alignment/Gaze Preference: Within Defined Limits Tracking/Visual Pursuits: Able to track stimulus in all quads without difficulty Visual Fields: No apparent deficits Additional Comments: No apparent visual deficits. Able to functionally utilize vision.      Perception     Praxis Praxis Praxis-Other Comments: Appears functional; will continue to assess.    Pertinent Vitals/Pain Pain Assessment: Faces Faces Pain Scale: Hurts a little bit Pain Location: R forearm/wrist/hand Pain Descriptors / Indicators: Pressure Pain Intervention(s): Monitored during session     Hand Dominance Right   Extremity/Trunk Assessment Upper Extremity Assessment Upper Extremity Assessment: RUE deficits/detail RUE Deficits / Details: Shoulder flexion 4+/5 strength, elbow flexion/extension 4/5, wrist flexion/extension 3/5, grasp strength 2/5.  Poor  fine motor coordination and slightly diminished gross motor  coordination.  RUE Sensation:  (in tact) RUE Coordination: decreased fine motor;decreased gross motor (fine motor more affected than gross)   Lower Extremity Assessment Lower Extremity Assessment: Overall WFL for tasks assessed   Cervical / Trunk Assessment Cervical / Trunk Assessment: Normal   Communication Communication Communication: Expressive difficulties   Cognition Arousal/Alertness: Awake/alert Behavior During Therapy: Flat affect Overall Cognitive Status: Difficult to assess Area of Impairment: Memory;Problem solving                     Memory: Decreased short-term memory       Problem Solving: Slow processing;Decreased initiation;Difficulty sequencing;Requires verbal cues;Requires tactile cues General Comments: Pt with significant word finding deficits limiting full congitive assessment. Pt requiring VC's for pathfinding task in hallway and with difficulty completing simple math tasks.    General Comments       Exercises Exercises: Other exercises Other Exercises Other Exercises: Instructed pt in HEP for digit composite flexion and extension as well as forearm supination and pronation.    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        OT Problem List: Decreased strength;Decreased range of motion;Decreased activity tolerance;Impaired balance (sitting and/or standing);Decreased safety awareness;Decreased knowledge of use of DME or AE;Decreased knowledge of  precautions;Impaired UE functional use;Pain;Decreased coordination      OT Treatment/Interventions: Self-care/ADL training;Therapeutic exercise;Energy conservation;DME and/or AE instruction;Therapeutic activities;Balance training;Patient/family education;Cognitive remediation/compensation    OT Goals(Current goals can be found in the care plan section) Acute Rehab OT Goals Patient Stated Goal: return home OT Goal Formulation: With patient/family Time For Goal Achievement: 12/13/16 Potential to Achieve Goals: Good ADL Goals Pt Will Perform Eating: with modified independence;with adaptive utensils;sitting Pt Will Perform Grooming: sitting;with adaptive equipment;with supervision Pt Will Perform Tub/Shower Transfer: Tub transfer;ambulating;rolling walker;with supervision Pt/caregiver will Perform Home Exercise Program: Increased strength;Right Upper extremity;With written HEP provided;Independently (increased strength and coordination) Additional ADL Goal #1: Pt will complete simple pathfinding task in a moderately distracting environment with no more than 1 VC.  OT Frequency: Min 2X/week   Barriers to D/C:            Co-evaluation              AM-PAC PT "6 Clicks" Daily Activity     Outcome Measure Help from another person eating meals?: A Little Help from another person taking care of personal grooming?: A Lot Help from another person toileting, which includes using toliet, bedpan, or urinal?: A Little Help from another person bathing (including washing, rinsing, drying)?: A Little Help from another person to put on and taking off regular upper body clothing?: A Lot Help from another person to put on and taking off regular lower body clothing?: A Little 6 Click Score: 16   End of Session Equipment Utilized During Treatment: Gait belt;Rolling walker Nurse Communication: Mobility status  Activity Tolerance: Patient tolerated treatment well Patient left: in bed;with call  bell/phone within  reach;with family/visitor present (sitting at EOB)  OT Visit Diagnosis: Hemiplegia and hemiparesis;Cognitive communication deficit (R41.841);Pain Symptoms and signs involving cognitive functions: Cerebral infarction Hemiplegia - Right/Left: Right Hemiplegia - dominant/non-dominant: Dominant Hemiplegia - caused by: Cerebral infarction Pain - Right/Left: Right Pain - part of body: Hand;Arm                Time: 1610-9604 OT Time Calculation (min): 23 min Charges:  OT General Charges $OT Visit: 1 Procedure OT Evaluation $OT Eval Low Complexity: 1 Procedure OT Treatments $Self Care/Home Management : 8-22 mins G-Codes:     Doristine Section, MS OTR/L  Pager: [phone]   Lavren Lewan A Angela Platner 11/29/2016, 4:39 PM

## 2016-11-29 NOTE — Progress Notes (Signed)
leftSTROKE TEAM PROGRESS NOTE   HISTORY OF PRESENT ILLNESS (per record) Manuel Boyer is an 26 y.o. male with no significant past medical history.  Approximately 5 days ago patient noticed some neck discomfort with slurred speech and some right arm weakness. On the night of 11/27/2016 at 2000 hrs. patient noted worsening of these symptoms and new word finding difficulties and right arm weakness. Patient was brought to the hospital 11/28/2016.  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 There is also noted hypodensity in the left frontal lobe most compatible with a subacute infarct. The occlusion the left carotid internal artery with a taper lumen as noted above was consistent with a dissection. 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 As far as family is aware, patient does not have sickle cell. 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 Due to the multiple arterial abnormalities secondary to suspected moyamoya and the leptomeningeal collaterals, the patient underwent conventional angiogram to rule-out M1 occlusion and the need for IR intervention.  Diagnostic labs for collagen vascular disease, lupus, sickle cell, vasculitis, and other vascular disorders ordered.  Date last known well: Date: 11/23/2016 Time last known well: Time: 20:00  Patient was not administered IV t-PA secondary to presenting outside of treatment window. He was admitted to General Neurology for further evaluation and treatment.   SUBJECTIVE (INTERVAL HISTORY) His mother and sister are at bedside.  The patient is awake and alert, with some improvement of his expressive aphasia.he has no prior history of stroke, TIA, and deepen thrombosis, pulmonary embolism. There is no family history of stroke in a young age. Patient denies any history of sickle cell disease or trait. There is no history of childhood  meningitis or encephalitis.there are no stigmata of neurofibromatosis or any marfanoid features   OBJECTIVE Temp:  [97 F (36.1 C)-99.2 F (37.3 C)] 98.4 F (36.9 C) (06/27 0600) Pulse Rate:  [44-79] 52 (06/27 0600) Cardiac Rhythm: Normal sinus rhythm (06/27 0821) Resp:  [14-25] 18 (06/27 0600) BP: (105-130)/(59-90) 122/82 (06/27 0600) SpO2:  [96 %-100 %] 98 % (06/27 0600) Weight:  [107 kg (236 lb)] 107 kg (236 lb) (06/26 1001)  CBC:  Recent Labs Lab 11/28/16 1007 11/28/16 1019 11/29/16 0626  WBC 6.7  --  6.4  NEUTROABS 4.4  --   --   HGB 14.9 15.3 13.8  HCT 45.4 45.0 42.5  MCV 93.0  --  93.4  PLT 224  --  215    Basic Metabolic Panel:  Recent Labs Lab 11/28/16 1007 11/28/16 1019 11/29/16 0626  NA 141 140 139  K 3.9 4.0 3.8  CL 108 106 108  CO2 26  --  24  GLUCOSE 100* 93 97  BUN 12 16 8   CREATININE 1.07 1.00 0.96  CALCIUM 9.3  --  8.7*    Lipid Panel:    Component Value Date/Time   CHOL 145 11/29/2016 0626   TRIG 74 11/29/2016 0626   HDL 25 (L) 11/29/2016 0626   CHOLHDL 5.8 11/29/2016 0626   VLDL 15 11/29/2016 0626   LDLCALC 105 (H) 11/29/2016 0626   HgbA1c: No results found for: HGBA1C Urine Drug Screen:    Component Value Date/Time   LABOPIA NONE DETECTED 11/28/2016 1031   COCAINSCRNUR NONE DETECTED 11/28/2016 1031   LABBENZ NONE DETECTED 11/28/2016 1031   AMPHETMU NONE DETECTED 11/28/2016 1031   THCU POSITIVE (A) 11/28/2016 1031   LABBARB NONE  DETECTED 11/28/2016 1031    Alcohol Level     Component Value Date/Time   ETH <5 11/28/2016 1007    IMAGING  Ct Angio Head W and Wo Contrast 11/28/2016 IMPRESSION: Hypodensity left frontal lobe most compatible with subacute infarct. This extends into the left basal ganglia. Negative for hemorrhage Occlusion proximal left internal carotid artery with a tapered lumen consistent with dissection. Occlusion of the supraclinoid internal carotid artery bilaterally with multiple small leptomeningeal  collaterals compatible with moyamoya. There is diffuse irregularity of anterior, middle, posterior cerebral artery branches. Differential includes collagen vascular disease, lupus, sickle cell, vasculitis, and other vascular disorders   Dg Chest Port 1 View 11/28/2016 IMPRESSION: Cardiomegaly.     PHYSICAL EXAM Obese young [place] currently not in distress. . Afebrile. Head is nontraumatic. Neck is supple without bruit.    Cardiac exam no murmur or gallop. Lungs are clear to auscultation. Distal pulses are well felt. Neurological Exam :  Awake alert oriented x 3 nonfluent hesitant speech with some word finding difficulties. Able to name repeat and comprehend quite well. Mild dysarthria.Mild right lower face asymmetry. Tongue midline. No drift. Mild diminished fine finger movements on left. Orbits left over rightupper extremity. Mild right grip weak.. Normal sensation . Normal coordination.  ASSESSMENT/PLAN Manuel Boyer is a 26 y.o. male with no significant past medical history presenting with neck pain, right arm weakness, and expressive aphasia.Marland Kitchen. He did not receive IV t-PA due to presenting outside of the treatment window.   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 No obvious etiology like sickle cell disease, neurofibromatosis, remote meningitis or connective tissue disease by history or exam.  Resultant  Mild expressive aphasia and right face and hand weakness  CT Angio: Subacute left frontal infarct and occlusions of left ICA, bilateral carotids in the setting of left ICA dissection and diffuse occlusions of multiple small leptomeningeal collaterals and diffuse irregularity of anterior, middle, and  posterior cerebral artery branches.  2D Echo  Pending    LDL 105  HgbA1c pending   Lovenox 40 mg sq daily for VTE prophylaxis  Diet Heart Room service appropriate? Yes; Fluid consistency: Thin  No antithrombotic prior to admission, now on aspirin 81 mg daily and clopidogrel 75 mg daily  Patient counseled to be compliant with his antithrombotic medications  Ongoing aggressive stroke risk factor management  Therapy recommendations: Outpatient OT;Supervision/Assistance - 24 hour   Disposition:  pending**  Hyperlipidemia  Home meds: none  LDL 105, goal < 70  Started on pravastatin 80 mg PO daily  Continue statin at discharge   Other Stroke Risk Factors  Obesity, Body mass index is 33.86 kg/m., recommend weight loss, diet and exercise as appropriate   Other Active Problems  none  Hospital day # 1  I have personally examined this patient, reviewed notes, independently viewed imaging studies, participated in medical decision making and plan of care.ROS completed by me personally and pertinent positives fully documented  I have made any additions or clarifications directly to the above note.  Patient presented with expressive aphasia and mild right-sided weakness secondary to subacute left frontal infarct and angiogram shows moyamoya like appearance with bilateral carotid occlusions. Extracranial left carotid dissection his unusual and typically not the part of moyamoya. Workup for autoimmune and  intermittently he disorders and sickle cell is pending. Recommend aspirin and Plavix for 3 months followed by aspirin alone and aggressive risk factor modification. Long discussion with the patient, wife and mother and consult questions. Greater than 50% time during this 35 minute visit was spent on counseling and coordination of care about his stroke, bilateral carotid occlusions, evaluation and treatment plan discussion. Delia Heady, MD Medical Director [facility] Stroke  Center Pager: [phone] 11/29/2016 8:01 PM   To contact Stroke Continuity provider, please refer to [email or web address]. After hours, contact General Neurology

## 2016-11-29 NOTE — Evaluation (Signed)
Physical Therapy Evaluation Patient Details Name: Manuel Boyer MRN: [id number] DOB: Apr 03, 1991 Today's Date: 11/29/2016   History of Present Illness  Pt is a 26 y/o male admitted secondary to slurred speech and R UE weakness. 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 PMH including but not limited to tobacco use and hyperglycemia.   Clinical Impression  Pt presented supine in bed with HOB elevated, awake and willing to participate in therapy session. Prior to admission, pt reported that he was independent with all functional mobility and ADLs. Pt works full-time but was unable to express what he does. Pt ambulated in hallway with supervision without any AD, no instability or LOB. Pt also successfully completed stair training with no concerns. No further acute PT needs identified at this time. PT signing off.    Follow Up Recommendations No PT follow up    Equipment Recommendations  None recommended by PT    Recommendations for Other Services       Precautions / Restrictions Restrictions Weight Bearing Restrictions: No      Mobility  Bed Mobility Overal bed mobility: Modified Independent                Transfers Overall transfer level: Needs assistance Equipment used: None Transfers: Sit to/from Stand Sit to Stand: Supervision         General transfer comment: for safety  Ambulation/Gait Ambulation/Gait assistance: Supervision Ambulation Distance (Feet): 200 Feet Assistive device: None Gait Pattern/deviations: Step-through pattern Gait velocity: WFL Gait velocity interpretation: at or above normal speed for age/gender General Gait Details: no instability or LOB, supervision for safety  Stairs Stairs: Yes Stairs assistance: Supervision Stair Management: No rails;Alternating pattern;Forwards Number of Stairs: 2 General stair comments: no instability or LOB, supervision for safety  Wheelchair Mobility    Modified Rankin (Stroke Patients  Only) Modified Rankin (Stroke Patients Only) Pre-Morbid Rankin Score: No symptoms Modified Rankin: No significant disability     Balance Overall balance assessment: Needs assistance Sitting-balance support: Feet supported;No upper extremity supported Sitting balance-Leahy Scale: Normal     Standing balance support: During functional activity;No upper extremity supported Standing balance-Leahy Scale: Good                               Pertinent Vitals/Pain Pain Assessment: Faces Faces Pain Scale: Hurts a little bit Pain Location: R forearm/wrist/hand Pain Descriptors / Indicators: Sharp;Pressure Pain Intervention(s): Monitored during session;Repositioned    Home Living Family/patient expects to be discharged to:: Private residence Living Arrangements: Spouse/significant other;Children Available Help at Discharge: Family Type of Home: House Home Access: Stairs to enter Entrance Stairs-Rails: None Secretary/administratorntrance Stairs-Number of Steps: 3 Home Layout: One level Home Equipment: None      Prior Function Level of Independence: Independent         Comments: works full-time     Higher education careers adviserHand Dominance   Dominant Hand: Right    Extremity/Trunk Assessment   Upper Extremity Assessment Upper Extremity Assessment: Defer to OT evaluation    Lower Extremity Assessment Lower Extremity Assessment: Overall WFL for tasks assessed    Cervical / Trunk Assessment Cervical / Trunk Assessment: Normal  Communication   Communication: Expressive difficulties  Cognition Arousal/Alertness: Awake/alert Behavior During Therapy: Flat affect Overall Cognitive Status: Impaired/Different from baseline Area of Impairment: Memory                     Memory: Decreased short-term memory  General Comments      Exercises     Assessment/Plan    PT Assessment Patent does not need any further PT services  PT Problem List         PT Treatment  Interventions      PT Goals (Current goals can be found in the Care Plan section)  Acute Rehab PT Goals Patient Stated Goal: return home    Frequency     Barriers to discharge        Co-evaluation               AM-PAC PT "6 Clicks" Daily Activity  Outcome Measure Difficulty turning over in bed (including adjusting bedclothes, sheets and blankets)?: None Difficulty moving from lying on back to sitting on the side of the bed? : None Difficulty sitting down on and standing up from a chair with arms (e.g., wheelchair, bedside commode, etc,.)?: None Help needed moving to and from a bed to chair (including a wheelchair)?: None Help needed walking in hospital room?: None Help needed climbing 3-5 steps with a railing? : None 6 Click Score: 24    End of Session   Activity Tolerance: Patient tolerated treatment well Patient left: with call bell/phone within reach;with family/visitor present;Other (comment) (sitting EOB eating lunch) Nurse Communication: Mobility status PT Visit Diagnosis: Other symptoms and signs involving the nervous system (W09.811)    Time: 9147-8295 PT Time Calculation (min) (ACUTE ONLY): 15 min   Charges:   PT Evaluation $PT Eval Moderate Complexity: 1 Procedure     PT G Codes:        Deborah Chalk, PT, DPT [phone]   Alessandra Bevels Sheretha Shadd 11/29/2016, 1:56 PM

## 2016-11-29 NOTE — Progress Notes (Signed)
CSW met with patient and family at bedside due to request from patient's mother to receive assistance to begin an application for disability. CSW explained that [employer] would need to be contacted in order to begin a disability application, and approval would occur through them. CSW indicated that if the patient or family needed medical documentation to support the application, the medical records office could be contacted to assist, or the patient's information could be accessed via MyChart in order to print out any medical documentation that may be needed.  Laveda Abbe, [place] Clinical Social Worker [phone]

## 2016-11-29 NOTE — Progress Notes (Signed)
   Subjective: Continues to have word finding difficulties today but feels like the strength in his hand has improved somewhat. He says that he's been working on his hand strength on its own overnight. He continued to have a headache despite tylenol overnight but that has resolved this morning. His wife and mother are in the room, they do not know of any family members with autoimmune disease. They ask many good questions and are updated on the plan.   Objective:  Vital signs in last 24 hours: Vitals:   11/29/16 0230 11/29/16 0430 11/29/16 0600 11/29/16 1059  BP: 120/72 126/80 122/82 116/74  Pulse: (!) 56 (!) 52 (!) 52 (!) 50  Resp: '18 18 18 18  '$ Temp:  98.2 F (36.8 C) 98.4 F (36.9 C) 98.4 F (36.9 C)  TempSrc:  Oral Oral Oral  SpO2: 98% 100% 98% 98%  Weight:      Height:       Physical Exam  Constitutional: He appears well-developed and well-nourished. No distress.  HENT:  Head: Normocephalic and atraumatic.  Cardiovascular: Normal rate and regular rhythm.   No murmur heard. Pulmonary/Chest: Effort normal. No respiratory distress. He has no wheezes. He has no rales.  Abdominal: Soft. He exhibits no distension. There is no tenderness. There is no guarding.  Neurological: He is alert.  Word finding difficulties, mild right sided facial droop, strength in right hand 3 out of 5, arm strength equal and intact, lower extremity strength equal and intact   Skin: Skin is warm and dry. He is not diaphoretic.  Psychiatric: He has a normal mood and affect. His behavior is normal.   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 It is reassuring that his hand strength is somewhat improved however given that he is right handed and has right hand weekness I believe he would benefit from CIR and intensive rehabilitation, awaiting SLP  and OT consult and recommendations regarding this. BP has been well controlled.   - ESR and CRP are within the normal limit, F/U ANA, CANCA, and PANCA - follow up sickle cell panel -PT has evaluated him and signed off  -HIV antibody non reactive  ASCVD 5.3% recommending moderate intensity statin > started pravastatin 80 mg daily   Vitamin D deficiency  -follow up vitamin D level   Dispo: Anticipated discharge in approximately 1-2 day(s).   Ledell Noss, MD 11/29/2016, 2:45 PM Pager: [phone]

## 2016-11-30 ENCOUNTER — Encounter (HOSPITAL_COMMUNITY): Payer: Self-pay | Admitting: Interventional Radiology

## 2016-11-30 ENCOUNTER — Other Ambulatory Visit (HOSPITAL_COMMUNITY): Payer: BLUE CROSS/BLUE SHIELD

## 2016-11-30 LAB — HEMOGLOBIN A1C
Hgb A1c MFr Bld: 5.7 % — ABNORMAL HIGH (ref 4.8–5.6)
MEAN PLASMA GLUCOSE: 117 mg/dL

## 2016-11-30 LAB — BASIC METABOLIC PANEL
ANION GAP: 8 (ref 5–15)
BUN: 8 mg/dL (ref 6–20)
CHLORIDE: 107 mmol/L (ref 101–111)
CO2: 24 mmol/L (ref 22–32)
Calcium: 9 mg/dL (ref 8.9–10.3)
Creatinine, Ser: 1.01 mg/dL (ref 0.61–1.24)
GFR calc non Af Amer: >60 mL/min (ref >60)
Glucose, Bld: 98 mg/dL (ref 65–99)
Potassium: 3.8 mmol/L (ref 3.5–5.1)
Sodium: 139 mmol/L (ref 135–145)

## 2016-11-30 LAB — VITAMIN D 25 HYDROXY (VIT D DEFICIENCY, FRACTURES): VIT D 25 HYDROXY: 29.1 ng/mL — AB (ref 30.0–100.0)

## 2016-11-30 LAB — SICKLE CELL SCREEN: SICKLE CELL SCREEN: NEGATIVE

## 2016-11-30 LAB — ANA W/REFLEX IF POSITIVE: Anti Nuclear Antibody(ANA): NEGATIVE

## 2016-11-30 LAB — ANCA TITERS
C-ANCA: <1:20 {titer}
P-ANCA: <1:20 {titer}

## 2016-11-30 LAB — MPO/PR-3 (ANCA) ANTIBODIES
ANCA Proteinase 3: <3.5 U/mL (ref 0.0–3.5)
Myeloperoxidase Abs: <9 U/mL (ref 0.0–9.0)

## 2016-11-30 LAB — SAVE SMEAR

## 2016-11-30 LAB — HIV ANTIBODY (ROUTINE TESTING W REFLEX): HIV Screen 4th Generation wRfx: NONREACTIVE

## 2016-11-30 MED ORDER — PRAVASTATIN SODIUM 40 MG PO TABS: 80.0000 mg | ORAL_TABLET | Freq: Every day | ORAL | Status: DC

## 2016-11-30 MED ORDER — PRAVASTATIN SODIUM 20 MG PO TABS
20.0000 mg | ORAL_TABLET | Freq: Every day | ORAL | Status: DC
  Administered 2016-11-30 – 2016-12-01 (×2): 20 mg via ORAL
  Filled 2016-11-30 (×2): qty 1

## 2016-11-30 NOTE — Progress Notes (Signed)
leftSTROKE TEAM PROGRESS NOTE   HISTORY OF PRESENT ILLNESS (per record) Quashaun Ocallaghan is an 26 y.o. male with no significant past medical history.  Approximately 5 days ago patient noticed some neck discomfort with slurred speech and some right arm weakness. On the night of 11/27/2016 at 2000 hrs. patient noted worsening of these symptoms and new word finding difficulties and right arm weakness. Patient was brought to the hospital 11/28/2016.  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 There is also noted hypodensity in the left frontal lobe most compatible with a subacute infarct. The occlusion the left carotid internal artery with a taper lumen as noted above was consistent with a dissection. 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 As far as family is aware, patient does not have sickle cell. 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 Due to the multiple arterial abnormalities secondary to suspected moyamoya and the leptomeningeal collaterals, the patient underwent conventional angiogram to rule-out M1 occlusion and the need for IR intervention.  Diagnostic labs for collagen vascular disease, lupus, sickle cell, vasculitis, and other vascular disorders ordered.  Date last known well: Date: 11/23/2016 Time last known well: Time: 20:00  Patient was not administered IV t-PA secondary to presenting outside of treatment window. He was admitted to General Neurology for further evaluation and treatment.   SUBJECTIVE (INTERVAL HISTORY) His mother and sister are at bedside.  The patient is awake and alert, with some improvement of his expressive aphasia.he has no prior history of stroke, TIA, and deepen thrombosis, pulmonary embolism. There is no family history of stroke in a young age. Patient denies any history of sickle cell disease or trait. There is no history of childhood  meningitis or encephalitis.there are no stigmata of neurofibromatosis or any marfanoid features   OBJECTIVE Temp:  [97.8 F (36.6 C)-99 F (37.2 C)] 98.9 F (37.2 C) (06/28 0938) Pulse Rate:  [45-61] 61 (06/28 0938) Cardiac Rhythm: Sinus bradycardia (06/28 0700) Resp:  [18-19] 19 (06/28 0938) BP: (110-134)/(54-75) 110/54 (06/28 0938) SpO2:  [98 %-99 %] 99 % (06/28 0938)  CBC:   Recent Labs Lab 11/28/16 1007 11/28/16 1019 11/29/16 0626  WBC 6.7  --  6.4  NEUTROABS 4.4  --   --   HGB 14.9 15.3 13.8  HCT 45.4 45.0 42.5  MCV 93.0  --  93.4  PLT 224  --  215    Basic Metabolic Panel:   Recent Labs Lab 11/29/16 0626 11/30/16 0507  NA 139 139  K 3.8 3.8  CL 108 107  CO2 24 24  GLUCOSE 97 98  BUN 8 8  CREATININE 0.96 1.01  CALCIUM 8.7* 9.0    Lipid Panel:     Component Value Date/Time   CHOL 145 11/29/2016 0626   TRIG 74 11/29/2016 0626   HDL 25 (L) 11/29/2016 0626   CHOLHDL 5.8 11/29/2016 0626   VLDL 15 11/29/2016 0626   LDLCALC 105 (H) 11/29/2016 0626   HgbA1c:  Lab Results  Component Value Date   HGBA1C 5.7 (H) 11/29/2016   Urine Drug Screen:     Component Value Date/Time   LABOPIA NONE DETECTED 11/28/2016 1031   COCAINSCRNUR NONE DETECTED 11/28/2016 1031   LABBENZ NONE DETECTED 11/28/2016 1031   AMPHETMU NONE DETECTED 11/28/2016 1031   THCU POSITIVE (A) 11/28/2016 1031   LABBARB NONE DETECTED 11/28/2016 1031    Alcohol Level     Component  Value Date/Time   ETH <5 11/28/2016 1007    IMAGING  Ct Angio Head W and Wo Contrast 11/28/2016 IMPRESSION: Hypodensity left frontal lobe most compatible with subacute infarct. This extends into the left basal ganglia. Negative for hemorrhage Occlusion proximal left internal carotid artery with a tapered lumen consistent with dissection. 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 There is diffuse irregularity of  anterior, middle, posterior cerebral artery branches. Differential includes collagen vascular disease, lupus, sickle cell, vasculitis, and other vascular disorders   Dg Chest Port 1 View 11/28/2016 IMPRESSION: Cardiomegaly.     PHYSICAL EXAM Obese young [place] American male currently not in distress. . Afebrile. Head is nontraumatic. Neck is supple without bruit.    Cardiac exam no murmur or gallop. Lungs are clear to auscultation. Distal pulses are well felt. Neurological Exam :  Awake alert oriented x 3 nonfluent hesitant speech with some word finding difficulties. Able to name repeat and comprehend quite well. Mild dysarthria.Mild right lower face asymmetry. Tongue midline. No drift. Mild diminished fine finger movements on right with grip weaknesst. Orbits left over rightupper extremity. Mild right grip weak.. Normal sensation . Normal coordination.  ASSESSMENT/PLAN Mr. Derrik Mceachern is a 26 y.o. male with no significant past medical history presenting with neck pain, right arm weakness, and expressive aphasia.Marland Kitchen He did not receive IV t-PA due to presenting outside of the treatment window.   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 No obvious etiology like sickle cell disease, neurofibromatosis, remote meningitis or connective tissue disease by history or exam.  Resultant  Mild expressive aphasia and right face and hand weakness  CT Angio: Subacute left frontal infarct and occlusions of left ICA, bilateral carotids in the setting of left ICA dissection and diffuse occlusions of multiple small leptomeningeal collaterals and diffuse irregularity of anterior, middle, and posterior cerebral artery branches.  2D Echo   Pending    LDL 105  HgbA1c 5.7  Lovenox 40 mg sq daily for VTE prophylaxis Diet Heart Room service appropriate? Yes; Fluid consistency: Thin  No antithrombotic prior to admission, now on aspirin 81 mg daily and clopidogrel 75 mg daily  Patient counseled to be compliant with his antithrombotic medications  Ongoing aggressive stroke risk factor management  Therapy recommendations: Outpatient OT;Supervision/Assistance - 24 hour   Disposition:  pending**  Hyperlipidemia  Home meds: none  LDL 105, goal < 70  Started on pravastatin 80 mg PO daily  Continue statin at discharge   Other Stroke Risk Factors  Obesity, Body mass index is 33.86 kg/m., recommend weight loss, diet and exercise as appropriate   Other Active Problems  none  Hospital day # 2  I have personally examined this patient, reviewed notes, independently viewed imaging studies, participated in medical decision making and plan of care.ROS completed by me personally and pertinent positives fully documented  I have made any additions or clarifications directly to the above note.  Patient presented with expressive aphasia and mild right-sided weakness secondary to subacute left frontal infarct and angiogram shows moyamoya like appearance with bilateral carotid occlusions. Extracranial left carotid dissection is unusual and typically not the part of moyamoya but Fibromuscular dysplasia has been described in moya moya syndrome cases.. Workup for autoimmune and inflammatory  disorders and sickle cell is pending. Recommend aspirin and Plavix for 3 months followed by aspirin alone and aggressive risk factor modification. Long discussion with the patient, wife and mother and answered  questions. D/w Dr Obie DredgeBlum.Greater than 50% time during this 25 minute visit was spent on counseling and coordination of care about his stroke, bilateral carotid occlusions, evaluation and treatment plan discussion.f/u as outpatient in stroke clinic in  6weeks Delia HeadyPramod Sethi, MD Medical Director [facility] Pager: [phone] 11/30/2016 1:31 PM   To contact Stroke Continuity provider, please refer to [email or web address]. After hours, contact General Neurology

## 2016-11-30 NOTE — Progress Notes (Signed)
   Subjective: Reports improvement in word finding difficulties overnight. Continues to have difficulty with right hand grip strength and facial droop. He is motivated when working with rehab. No changes in vision or new weakness. Continues to have headache but finds relief with aleve.   Objective:  Vital signs in last 24 hours: Vitals:   11/29/16 1059 11/29/16 2100 11/30/16 0413 11/30/16 0938  BP: 116/74 131/69 134/75 (!) 110/54  Pulse: (!) 50 (!) 56 (!) 45 61  Resp: '18 18 18 19  '$ Temp: 98.4 F (36.9 C) 99 F (37.2 C) 97.8 F (36.6 C) 98.9 F (37.2 C)  TempSrc: Oral Oral Oral Oral  SpO2: 98% 99% 98% 99%  Weight:      Height:       Physical Exam  Constitutional: He is oriented to person, place, and time. He appears well-developed and well-nourished. No distress.  HENT:  Head: Normocephalic and atraumatic.  Cardiovascular: Normal rate and regular rhythm.   No murmur heard. Pulmonary/Chest: Effort normal. No respiratory distress. He has no wheezes. He has no rales.  Abdominal: Soft. He exhibits no distension. There is no tenderness. There is no guarding.  Neurological: He is alert and oriented to person, place, and time.  Word finding difficulties, mild right sided facial droop, strength in right hand 2 out of 5, arm strength equal and intact, lower extremity strength equal and intact   Skin: Skin is warm and dry. He is not diaphoretic.  Psychiatric: He has a normal mood and affect. His behavior is normal.   Assessment/Plan:    CVA (cerebral vascular accident) ([place])   Hyperglycemia   Vitamin D deficiency Deficits seem overall stable from yesterday but there is no worsening or new deficits. SLP and OT have recommended continued rehab so this will be arranged for him to continue outpatient. LDL and Hemoglobin A1c are in the normal range. This presentation is very concerning, we are attempting to work through further differentials and appreciate neurology's recommendations.  - ESR  and CRP are within the normal limit, F/U ANA, CANCA, and PANCA, anti phospholipid antibody.   - follow up sickle cell panel LDL is 105, slightly above the cut off but he may benefit from starting low dose pravastatin 20 mg daily   Vitamin D deficiency  -follow up vitamin D level   Dispo: Anticipated discharge in approximately 1-2 day(s).   Ledell Noss, MD 11/30/2016, 1:26 PM Pager: [phone]

## 2016-11-30 NOTE — Evaluation (Addendum)
Speech Language Pathology Evaluation Patient Details Name: Manuel Boyer MRN: [id number] DOB: 06-25-1990 Today's Date: 11/30/2016 Time: 4098-1191 SLP Time Calculation (min) (ACUTE ONLY): 21 min  Problem List:  Patient Active Problem List   Diagnosis Date Noted  . CVA (cerebral vascular accident) ([facility]) 11/28/2016  . Hyperglycemia 11/28/2016  . Vitamin D deficiency 11/28/2016   Past Medical History: History reviewed. No pertinent past medical history. Past Surgical History:  Past Surgical History:  Procedure Laterality Date  . IR ANGIO INTRA EXTRACRAN SEL COM CAROTID INNOMINATE BILAT MOD SED  11/28/2016  . IR ANGIO VERTEBRAL SEL VERTEBRAL BILAT MOD SED  11/28/2016  . RADIOLOGY WITH ANESTHESIA N/A 11/28/2016   Procedure: RADIOLOGY WITH ANESTHESIA;  Surgeon: Julieanne Cotton, MD;  Location: [facility] OR;  Service: Radiology;  Laterality: N/A;   HPI:  Pt is a 26 y.o. male with PMH of tobacco use, hyperglycemia, genital herpes, and vitamin D deficiency who presented to ED on 6/26 with pain in his right arm and left hand, slurred speechand right hand weakness which began the previous Wednesday and worsened over the last 2 days. Reportedly with expressive aphasia with some improvements since admission. Head CT showed Hypodensity left frontal lobe most compatible with subacute infarct. This extends into the left basal ganglia. OT eval noted deficits with memory and problem solving. SLP eval ordered as part of stroke workup.   Assessment / Plan / Recommendation Clinical Impression  Pt currently with a moderate aphasia, with expressive language greater impaired than receptive. Connected speech is slowed and laborious with pauses between each word due to word finding difficulty, occasional agrammatic sentences, difficulty expressing abstract thoughts. Pt regularly stated "I just don't know" when confronting word finding difficulty in conversation. Pt completed confrontation naming tasks with 100% accuracy  but at times taking 3-5 seconds for word retrieval. Speech errors consisted of neologisms and pt was consistently aware of errors when they occurred; similar errors in reading task. Receptive language mildly impaired as well with difficulties following multistep directions. Pt would benefit from continued speech therapy services at next level of care to increase functional communication skills, independence, and successful return to work. Pt and wife in agreement with plan; reviewed simple communication partner strategies such as phonemic/ semantic cues. Will continue to follow while in acute care.    SLP Assessment  SLP Recommendation/Assessment: Patient needs continued Speech Lanaguage Pathology Services SLP Visit Diagnosis: Aphasia (R47.01)    Follow Up Recommendations  Outpatient SLP    Frequency and Duration min 2x/week  2 weeks      SLP Evaluation Cognition  Overall Cognitive Status: Difficult to assess (due to aphasia) Arousal/Alertness: Awake/alert Orientation Level: Oriented to person;Oriented to place;Oriented to time;Disoriented to situation Attention: Selective Selective Attention: Appears intact Memory:  (difficult to assess) Awareness: Appears intact Problem Solving: Appears intact       Comprehension  Auditory Comprehension Overall Auditory Comprehension: Impaired Yes/No Questions: Within Functional Limits Commands: Impaired Two Step Basic Commands: 50-74% accurate Multistep Basic Commands: 50-74% accurate Reading Comprehension Reading Status: Impaired Word level: Within functional limits Sentence Level: Impaired    Expression Expression Primary Mode of Expression: Verbal Verbal Expression Overall Verbal Expression: Impaired Level of Generative/Spontaneous Verbalization: Sentence Repetition: No impairment Naming: Impairment Confrontation: Impaired (slow) Verbal Errors: Neologisms;Aware of errors Pragmatics: No impairment Written Expression Written  Expression: Unable to assess (comment) (R hand weakness)   Oral / Motor  Oral Motor/Sensory Function Overall Oral Motor/Sensory Function: Within functional limits Motor Speech Overall Motor Speech: Appears within functional limits  for tasks assessed Articulation: Within functional limitis   GO                    Metro Kungmy K Oleksiak, MA, CCC-SLP 11/30/2016, 10:06 AM [phone]

## 2016-11-30 NOTE — Progress Notes (Signed)
Transitions of Care Pharmacy Note  Plan:  Educated on new aspirin, clopidogrel, and pravastatin medications  --------------------------------------------- Manuel Boyer is an 26 y.o. male who presents with CVA. In anticipation of discharge, pharmacy has reviewed this patient's prior to admission medication history, as well as [facility] inpatient [facility].  Current medication indications, dosing, frequency, and notable side effects reviewed with patient and family. patient and family verbalized understanding of current inpatient medication regimen and is aware that the After Visit Summary when presented, will represent the most accurate medication list at discharge.   Assessment: Understanding of regimen: good Understanding of indications: good Potential of compliance: good Barriers to Obtaining Medications: No  Patient instructed to contact inpatient pharmacy team with further questions or concerns if needed.    Time spent preparing for discharge counseling: 10 minutes Time spent counseling patient: 10 minutes   Thank you for allowing pharmacy to be a part of this patient's care.  Carylon PerchesMaggie Demarques Pilz, PharmD Acute Care Pharmacy Resident  Pager: [phone] 11/30/2016

## 2016-12-01 ENCOUNTER — Telehealth: Payer: Self-pay

## 2016-12-01 ENCOUNTER — Inpatient Hospital Stay (HOSPITAL_COMMUNITY): Payer: BLUE CROSS/BLUE SHIELD

## 2016-12-01 DIAGNOSIS — I63231 Cerebral infarction due to unspecified occlusion or stenosis of right carotid arteries: Secondary | ICD-10-CM

## 2016-12-01 LAB — ECHOCARDIOGRAM COMPLETE
HEIGHTINCHES: 70 in
Weight: 3776 oz

## 2016-12-01 LAB — BASIC METABOLIC PANEL
Anion gap: 8 (ref 5–15)
BUN: 9 mg/dL (ref 6–20)
CALCIUM: 9.3 mg/dL (ref 8.9–10.3)
CHLORIDE: 105 mmol/L (ref 101–111)
CO2: 23 mmol/L (ref 22–32)
CREATININE: 0.98 mg/dL (ref 0.61–1.24)
GFR calc Af Amer: >60 mL/min (ref >60)
GFR calc non Af Amer: >60 mL/min (ref >60)
GLUCOSE: 100 mg/dL — AB (ref 65–99)
Potassium: 3.5 mmol/L (ref 3.5–5.1)
Sodium: 136 mmol/L (ref 135–145)

## 2016-12-01 MED ORDER — PRAVASTATIN SODIUM 20 MG PO TABS: 20.0000 mg | ORAL_TABLET | Freq: Every day | ORAL | 0 refills | Status: DC

## 2016-12-01 MED ORDER — CLOPIDOGREL BISULFATE 75 MG PO TABS: 75.0000 mg | ORAL_TABLET | Freq: Every day | ORAL | 3 refills | Status: DC

## 2016-12-01 MED ORDER — NAPROXEN 250 MG PO TABS
750.0000 mg | ORAL_TABLET | Freq: Two times a day (BID) | ORAL | Status: DC
  Administered 2016-12-01: 750 mg via ORAL
  Filled 2016-12-01: qty 3

## 2016-12-01 MED ORDER — ASPIRIN 81 MG PO TBEC: 81.0000 mg | DELAYED_RELEASE_TABLET | Freq: Every day | ORAL | 0 refills | Status: DC

## 2016-12-01 NOTE — Progress Notes (Signed)
  Echocardiogram 2D Echocardiogram has been performed.  Manuel Boyer [name] 12/01/2016, 2:27 PM

## 2016-12-01 NOTE — Progress Notes (Signed)
Occupational Therapy Treatment Patient Details Name: Manuel GensJamar Boyer MRN: [id number] DOB: [birth year] Today's Date: 12/01/2016    History of present illness Pt is a 26 y/o male admitted secondary to slurred speech and R UE weakness. 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 PMH including but not limited to tobacco use and hyperglycemia.    OT comments  Pt making good progress towards goals. Focus of session was providing education on built up handles for RUE and reinforcing HEP. Pt and wife with no further questions or concerns. Pt continues to require follow up at OPOT to maximize safety and independence in ADL and use of RUE.   Follow Up Recommendations  Outpatient OT;Supervision - Intermittent    Equipment Recommendations  None recommended by OT    Recommendations for Other Services      Precautions / Restrictions Precautions Precautions: Fall Restrictions Weight Bearing Restrictions: No       Mobility Bed Mobility Overal bed mobility: Modified Independent                Transfers Overall transfer level: Modified independent                    Balance Overall balance assessment: Needs assistance Sitting-balance support: Feet supported;No upper extremity supported Sitting balance-Leahy Scale: Normal     Standing balance support: During functional activity;No upper extremity supported Standing balance-Leahy Scale: Good                             ADL either performed or assessed with clinical judgement   ADL Overall ADL's : Needs assistance/impaired Eating/Feeding: Minimal assistance;Sitting Eating/Feeding Details (indicate cue type and reason): Pt able to hold utensils with R hand, and provided/educated about built up handles Grooming: Supervision/safety;Standing Grooming Details (indicate cue type and reason): Pt educated about building up handles for assist with RUE participation in grooming. Pt able to bring RUE to face, back of  head, and lower back for ADL                 Toilet Transfer: Modified Independent   Toileting- Clothing Manipulation and Hygiene: Supervision/safety;Sit to/from stand Toileting - Clothing Manipulation Details (indicate cue type and reason): doing with left hand     Functional mobility during ADLs: Modified independent       Vision   Vision Assessment?: No apparent visual deficits   Perception     Praxis      Cognition Arousal/Alertness: Awake/alert Behavior During Therapy: WFL for tasks assessed/performed Overall Cognitive Status: Difficult to assess (due to aphasia)                                          Exercises Exercises: Other exercises Other Exercises Other Exercises: Instructed pt in HEP for digit composite flexion and extension as well as forearm supination and pronation.  Other Exercises: PNF pattern for RUE   Shoulder Instructions       General Comments wife present for session    Pertinent Vitals/ Pain       Pain Assessment: 0-10 Pain Score: 2  Pain Location: R forearm/wrist/hand Pain Descriptors / Indicators: Pressure Pain Intervention(s): Monitored during session  Home Living  Prior Functioning/Environment              Frequency  Min 2X/week        Progress Toward Goals  OT Goals(current goals can now be found in the care plan section)  Progress towards OT goals: Progressing toward goals  Acute Rehab OT Goals Patient Stated Goal: return home OT Goal Formulation: With patient/family Time For Goal Achievement: 12/13/16 Potential to Achieve Goals: Good  Plan Discharge plan remains appropriate    Co-evaluation                 AM-PAC PT "6 Clicks" Daily Activity     Outcome Measure   Help from another person eating meals?: A Little Help from another person taking care of personal grooming?: A Little Help from another person toileting,  which includes using toliet, bedpan, or urinal?: A Little Help from another person bathing (including washing, rinsing, drying)?: A Little Help from another person to put on and taking off regular upper body clothing?: A Little Help from another person to put on and taking off regular lower body clothing?: A Little 6 Click Score: 18    End of Session    OT Visit Diagnosis: Hemiplegia and hemiparesis;Cognitive communication deficit (R41.841);Pain Symptoms and signs involving cognitive functions: Cerebral infarction Hemiplegia - Right/Left: Right Hemiplegia - dominant/non-dominant: Dominant Hemiplegia - caused by: Cerebral infarction Pain - Right/Left: Right Pain - part of body: Hand;Arm   Activity Tolerance Patient tolerated treatment well   Patient Left in bed;with call bell/phone within reach;with family/visitor present   Nurse Communication Mobility status        Time: 1610-9604 OT Time Calculation (min): 21 min  Charges: OT General Charges $OT Visit: 1 Procedure OT Treatments $Self Care/Home Management : 8-22 mins  Sherryl Manges OTR/L [phone]  Manuel Boyer 12/01/2016, 4:51 PM

## 2016-12-01 NOTE — Telephone Encounter (Signed)
Hospital TOC per Dr Obie DredgeBlum, discharge 12/01/2016, appt 12/08/2016.

## 2016-12-01 NOTE — Care Management Note (Signed)
Case Management Note  Patient Details  Name: Noe GensJamar Yeley MRN: [id number] Date of Birth: 24-Oct-1990  Subjective/Objective:                    Action/Plan: Patient d/cing home with self care. CM consulted for outpatient therapy. CM spoke with the patient and his wife and they would like to go to [facility]. Orders in EPIC and information on the AVS. Wife to transport home.  Expected Discharge Date:                  Expected Discharge Plan:  OP Rehab  In-House Referral:     Discharge planning Services  CM Consult  Post Acute Care Choice:    Choice offered to:  Patient  DME Arranged:    DME Agency:     HH Arranged:    HH Agency:     Status of Service:  Completed, signed off  If discussed at [employer] Length of Stay Meetings, dates discussed:    Additional Comments:  Kermit BaloKelli F Niva Murren, RN 12/01/2016, 2:34 PM

## 2016-12-01 NOTE — Discharge Instructions (Signed)
Intel CorporationJamar Boyer,   It has been a pleasure working with you and we are glad you're feeling better.   For your stroke,  START taking Aspirin and Plavix daily  Continue taking Plavix for the next 3 months ( until around 03/03/3017 ) then stop taking it and continue taking aspirin only   START taking pravastatin daily  STOP smoking cigarettes   If your symptoms worsen or you develop new symptoms, please seek medical help whether it is your primary care provider or emergency department.  If you have any questions about this hospitalization please call [phone].

## 2016-12-01 NOTE — Discharge Summary (Signed)
Name: Siegfried Vieth MRN: [id number] DOB: 1991-03-04 26 y.o. PCP: Patient, No Pcp Per  Date of Admission: 11/28/2016  9:49 AM Date of Discharge: 12/01/2016 Attending Physician: Lucious Groves, DO  Discharge Diagnosis: 1. CVA (cerebral vascular accident) [facility])  Discharge Medications: Allergies as of 12/01/2016   No Known Allergies     Medication List    TAKE these medications   aspirin 81 MG EC tablet Take 1 tablet (81 mg total) by mouth daily. Start taking on:  12/02/2016   clopidogrel 75 MG tablet Commonly known as:  PLAVIX Take 1 tablet (75 mg total) by mouth daily. Start taking on:  12/02/2016   pravastatin 20 MG tablet Commonly known as:  PRAVACHOL Take 1 tablet (20 mg total) by mouth daily at 6 PM.       Disposition and follow-up:   Mr.Tashon Greenawalt was discharged from [facility] in Good condition.  At the hospital follow up visit please address:  1.  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  - evaluate lipid panel about 1 month following hospitalization (~ October 30th 2018)  - discontinue plavix 3 months from hospitalization ( ~ September 30th 2018)  -recheck metformin in 3 months, if 5.7-6.4, consider starting metformin  -reinforce tobacco cessation   2.  Labs / imaging needed at time of follow-up: lipid panel ( 1-3 months from discharge), consider hepatitis screening   3.  Pending labs/ test needing follow-up: none   Follow-up Appointments: Follow-up Information    [facility].   Specialty:  Rehabilitation Why:  They will contact you for the first visit. Contact information: [address] [place] [phone] [place] [address] [place]. Go on 12/08/2016.   Why:  You have an appointment scheduled at 2:15  Contact information: [address] [place] [phone]       Garvin Fila, MD. Go in 6 week(s).   Specialties:  Neurology, Radiology Contact information: [address] [place] [address] [place] Hospital Course by problem list:    CVA (cerebral vascular accident) [facility]) Mr. Arnall is a 26 yo right hand dominant man with PMH vitamin D deficiency and tobacco use who presented with right hand weakness, word finding difficulties and right facial droop which began one week prior to presentation. 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 He was taken for IR with hopes for intervention and this showed occlusion of the right internal carotid artery in addition to the occluded left ICA and extensive collaterals. He denied any family or personal history of vasculitis or other autoimmune disease. An autoimmune workup was started in an attempt to find the cause of his moya- ESR, CRP, ANCAs, ANA, antiphospholipid panel and HIV were all negative. Sickle cell panel, homocysteine, and vitamin D level were also normal. Lipid panel showed LDL 105, he was started on pravastatin 20 mg daily for a goal of decreasing the LDL to <70.  His word finding difficulties and right hand strength improved gradually over the course of his workup. SLP and OT recommended continued outpatient therapy. He was started on aspirin and plavix. Lifestyle modifications were stressed - tobacco cessation, weight loss,   Discharge Vitals:   BP (!) 132/57 (BP Location:  Left Arm)   Pulse (!) 57   Temp 98.9 F (37.2 C) (Oral)   Resp 19   Ht _0  (1.778 m)   Wt 236 lb (107 kg)   SpO2 100%   BMI 33.86 kg/m   Pertinent Labs, Studies, and Procedures:  Hemoglobin A1c 5.7   CTangio head 6/26  IMPRESSION: Hypodensity left frontal lobe most compatible with subacute infarct. This extends into the left basal ganglia. Negative for  hemorrhage  Occlusion proximal left internal carotid artery with a tapered lumen consistent with dissection.  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 There is diffuse irregularity of anterior, middle, posterior cerebral artery branches. Differential includes collagen vascular disease, lupus, sickle cell, vasculitis, and other vascular Disorders  IR angio 6/26  Angiographically occluded right internal carotid artery just distal to the origin of the ophthalmic artery.  Angiography occluded left internal carotid artery at the bulb with a flame shaped configuration of the stump.  There is no distal reconstitution of the left internal carotid artery at the cranial skull base.  Extensive numerous fine arterial collaterals arising from the posterior cerebral artery P1, P2 and P3 regions bilaterally, from the anterior choroidal artery on the right side, with subsequent opacification of the right and left cerebral hemispheres as described above.  The angiographic appearance is suggestive of a Moya Moya angiographic appearance signifying the chronicity of the occlusions of the internal carotid arteries bilaterally.  Angiographic appearance of the left internal carotid artery proximally may suggest underlying pathology of recent onset, such as a dissection, which may have contributed to the patient's recent neurologic symptomatology.  Echocardiogram 6/29  Study Conclusions  - Left ventricle: The cavity size was normal. Systolic function was   normal. The estimated ejection fraction was in the range of 55%   to 60%. Wall motion was normal; there were no regional wall   motion abnormalities. Left ventricular diastolic function   parameters were normal. - Aortic valve: Valve area (Vmax): 2.14 cm^2. - Mitral valve: There was trivial regurgitation. - Right ventricle: Systolic  function was normal. - Right atrium: The atrium was normal in size. - Tricuspid valve: There was trivial regurgitation. - Inferior vena cava: The vessel was normal in size. - Pericardium, extracardiac: There was no pericardial effusion.  Discharge Instructions: Discharge Instructions    Ambulatory referral to Occupational Therapy    Complete by:  As directed    Ambulatory referral to Speech Therapy    Complete by:  As directed    Diet - low sodium heart healthy    Complete by:  As directed    Discharge instructions    Complete by:  As directed    [place],   It has been a pleasure working with you and we are glad you're feeling better.   For your stroke,  START taking Aspirin and Plavix daily  Continue taking Plavix for the next 3 months ( until around 03/03/3017 ) then stop taking it and continue taking aspirin only   START taking pravastatin daily  STOP smoking cigarettes   Follow up with your primary care provider in 1-2 weeks  If your symptoms worsen or you develop new symptoms, please seek medical help whether it is your primary care provider or emergency department.  If you have any questions about this hospitalization please call [phone].   Increase activity slowly    Complete by:  As directed  Signed: Ledell Noss, MD 12/01/2016, 2:48 PM   Pager: [phone]

## 2016-12-01 NOTE — Progress Notes (Addendum)
   Subjective: Word finding difficulties and hand strength have improved overnight. No new neuro deficits. Headache is still present but he actually has not yet tried aleve.   Objective:  Vital signs in last 24 hours: Vitals:   12/01/16 0212 12/01/16 0222 12/01/16 0606 12/01/16 0941  BP: (!) 100/40 116/63 (!) 111/58 (!) 132/57  Pulse: 62 (!) 50 (!) 52 (!) 57  Resp: _0 Temp: 97.9 F (36.6 C)  98.1 F (36.7 C) 98.9 F (37.2 C)  TempSrc: Oral  Oral Oral  SpO2: 93%  100% 100%  Weight:      Height:       Physical Exam  Constitutional: He is oriented to person, place, and time. He appears well-developed and well-nourished. No distress.  HENT:  Head: Normocephalic and atraumatic.  Cardiovascular: Normal rate and regular rhythm.   No murmur heard. Pulmonary/Chest: Effort normal. No respiratory distress. He has no wheezes. He has no rales.  Abdominal: Soft. He exhibits no distension. There is no tenderness. There is no guarding.  Neurological: He is alert and oriented to person, place, and time.  Word finding difficulties, very mild right sided facial droop, strength in right hand 3 out of 5, arm strength equal and intact, lower extremity strength equal and intact   Skin: Skin is warm and dry. He is not diaphoretic.  Psychiatric: He has a normal mood and affect. His behavior is normal.   Assessment/Plan:    CVA (cerebral vascular accident) ([facility])   Hyperglycemia   Vitamin D deficiency Deficits seem slightly improved from yesterday but there is no worsening or new deficits. Arranging for referral to outpatient OT and SLP now. Dr. Leonie Man has given his recommendations for further workup outpatient if his symptoms were to progress but we will continue with risk factor modification for now. He will follow up with Dr. Leonie Man and [facility].  - ESR and CRP, ANA, ANCA, and sickle cell panel are within the normal limit - F/U anti phospholipid antibody panel  - F/U homocysteine  - continue  pravastatin 20 mg daily  - Aspirin and plavix for 3 months followed by aspirin alone indefinitely   Vitamin D deficiency  Vitamin D level WNL   Dispo: Anticipated discharge in approximately 1-2 day(s).   Ledell Noss, MD 12/01/2016, 1:55 PM Pager: [phone]

## 2016-12-01 NOTE — Progress Notes (Signed)
leftSTROKE TEAM PROGRESS NOTE   HISTORY OF PRESENT ILLNESS (per record) Manuel Boyer is an 26 y.o. male with no significant past medical history.  Approximately 5 days ago patient noticed some neck discomfort with slurred speech and some right arm weakness. On the night of 11/27/2016 at 2000 hrs. patient noted worsening of these symptoms and new word finding difficulties and right arm weakness. Patient was brought to the hospital 11/28/2016.  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 There is also noted hypodensity in the left frontal lobe most compatible with a subacute infarct. The occlusion the left carotid internal artery with a taper lumen as noted above was consistent with a dissection. 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 As far as family is aware, patient does not have sickle cell. 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 Due to the multiple arterial abnormalities secondary to suspected moyamoya and the leptomeningeal collaterals, the patient underwent conventional angiogram to rule-out M1 occlusion and the need for IR intervention.  Diagnostic labs for collagen vascular disease, lupus, sickle cell, vasculitis, and other vascular disorders ordered.  Date last known well: Date: 11/23/2016 Time last known well: Time: 20:00  Patient was not administered IV t-PA secondary to presenting outside of treatment window. He was admitted to General Neurology for further evaluation and treatment.   SUBJECTIVE (INTERVAL HISTORY) His mother is at bedside.  The patient is awake and alert, with some improvement of his expressive aphasia. He admits to smoking marijuana. Sickle cell screen is negative .Vasculitic labs are negative so far  OBJECTIVE Temp:  [97.9 F (36.6 C)-99.1 F (37.3 C)] 98.9 F (37.2 C) (06/29 0941) Pulse Rate:  [50-62] 57 (06/29 0941) Cardiac Rhythm:  Sinus bradycardia (06/29 0700) Resp:  [18-20] 19 (06/29 0941) BP: (100-158)/(40-87) 132/57 (06/29 0941) SpO2:  [93 %-100 %] 100 % (06/29 0941)  CBC:   Recent Labs Lab 11/28/16 1007 11/28/16 1019 11/29/16 0626  WBC 6.7  --  6.4  NEUTROABS 4.4  --   --   HGB 14.9 15.3 13.8  HCT 45.4 45.0 42.5  MCV 93.0  --  93.4  PLT 224  --  215    Basic Metabolic Panel:   Recent Labs Lab 11/30/16 0507 12/01/16 0914  NA 139 136  K 3.8 3.5  CL 107 105  CO2 24 23  GLUCOSE 98 100*  BUN 8 9  CREATININE 1.01 0.98  CALCIUM 9.0 9.3    Lipid Panel:     Component Value Date/Time   CHOL 145 11/29/2016 0626   TRIG 74 11/29/2016 0626   HDL 25 (L) 11/29/2016 0626   CHOLHDL 5.8 11/29/2016 0626   VLDL 15 11/29/2016 0626   LDLCALC 105 (H) 11/29/2016 0626   HgbA1c:  Lab Results  Component Value Date   HGBA1C 5.7 (H) 11/29/2016   Urine Drug Screen:     Component Value Date/Time   LABOPIA NONE DETECTED 11/28/2016 1031   COCAINSCRNUR NONE DETECTED 11/28/2016 1031   LABBENZ NONE DETECTED 11/28/2016 1031   AMPHETMU NONE DETECTED 11/28/2016 1031   THCU POSITIVE (A) 11/28/2016 1031   LABBARB NONE DETECTED 11/28/2016 1031    Alcohol Level     Component Value Date/Time   ETH <5 11/28/2016 1007    IMAGING  Ct Angio Head W and Wo Contrast 11/28/2016 IMPRESSION: Hypodensity left frontal lobe most compatible with subacute infarct. This extends into the left basal ganglia.  Negative for hemorrhage Occlusion proximal left internal carotid artery with a tapered lumen consistent with dissection. 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 There is diffuse irregularity of anterior, middle, posterior cerebral artery branches. Differential includes collagen vascular disease, lupus, sickle cell, vasculitis, and other vascular disorders   Dg Chest Port 1 View 11/28/2016 IMPRESSION: Cardiomegaly.      PHYSICAL EXAM Obese young [place] male currently not in distress. . Afebrile. Head is nontraumatic. Neck is supple without bruit.    Cardiac exam no murmur or gallop. Lungs are clear to auscultation. Distal pulses are well felt. Neurological Exam :  Awake alert oriented x 3 nonfluent hesitant speech with some word finding difficulties. Able to name repeat and comprehend quite well. Mild dysarthria.Mild right lower face asymmetry. Tongue midline. No drift. Mild diminished fine finger movements on right with grip weaknesst. Orbits left over rightupper extremity. Mild right grip weak.. Normal sensation . Normal coordination.  ASSESSMENT/PLAN Manuel Boyer is a 26 y.o. male with no significant past medical history presenting with neck pain, right arm weakness, and expressive aphasia.Manuel Boyer. He did not receive IV t-PA due to presenting outside of the treatment window.   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 No obvious etiology like sickle cell disease, neurofibromatosis, remote meningitis or connective tissue disease by history or exam.  Resultant  Mild expressive aphasia and right face and hand weakness  CT Angio: Subacute left frontal infarct and occlusions of left ICA, bilateral carotids in the setting of left ICA dissection and diffuse occlusions of multiple small leptomeningeal collaterals and diffuse irregularity of anterior, middle, and posterior cerebral artery branches.  2D Echo  Pending    LDL 105  HgbA1c 5.7  Lovenox 40 mg sq daily for VTE prophylaxis Diet Heart Room service appropriate? Yes; Fluid consistency: Thin  No antithrombotic prior to admission, now on aspirin 81 mg daily and  clopidogrel 75 mg daily  Patient counseled to be compliant with his antithrombotic medications  Ongoing aggressive stroke risk factor management  Therapy recommendations: Outpatient OT;Supervision/Assistance - 24 hour   Disposition:  pending**  Hyperlipidemia  Home meds: none  LDL 105, goal < 70  Started on pravastatin 80 mg PO daily  Continue statin at discharge   Other Stroke Risk Factors  Obesity, Body mass index is 33.86 kg/m., recommend weight loss, diet and exercise as appropriate   Other Active Problems  none  Hospital day # 3  I have personally examined this patient, reviewed notes, independently viewed imaging studies, participated in medical decision making and plan of care.ROS completed by me personally and pertinent positives fully documented  I have made any additions or clarifications directly to the above note.  Patient presented with expressive aphasia and mild right-sided weakness secondary to subacute left frontal infarct and angiogram shows moyamoya like appearance with bilateral carotid occlusions. Extracranial left carotid dissection is unusual and typically not the part of moyamoya but Fibromuscular dysplasia has been described in a few moya moya syndrome cases.. Workup for autoimmune and inflammatory disorders and sickle cell is negative. Recommend aspirin and Plavix for 3 months followed by aspirin alone and aggressive risk factor modification. I strongly encouraged the patient to quit smoking cigarettes and marijuana and maintain a  healthy lifestyle and needs lots of fruits, vegetables, cereals and whole gr and lose weight. I also discussed briefly the role of external carotid to middle cerebral artery bypass surgery  As well as encephalomyosynangiosis - both of which are done only at large academic tertiary care centers. If he has recurrent symptoms he may indeed referral to a moyamoya Center .  Long discussion with the patient, wife and mother and answered   questions. D/w Dr Obie Dredge.Greater than 50% time during this 25 minute visit was spent on counseling and coordination of care about his stroke, bilateral carotid occlusions, evaluation and treatment plan discussion.f/u as outpatient in stroke clinic in 6weeks Delia Heady, MD Medical Director [facility] Pager: [phone] 12/01/2016 12:37 PM   To contact Stroke Continuity provider, please refer to [email or web address]. After hours, contact General Neurology

## 2016-12-04 LAB — HOMOCYSTEINE: HOMOCYSTEINE-NORM: 10.5 umol/L (ref 0.0–15.0)

## 2016-12-05 LAB — ANTIPHOSPHOLIPID SYNDROME EVAL, BLD
Anticardiolipin IgA: <9 APL U/mL (ref 0–11)
Anticardiolipin IgG: <9 GPL U/mL (ref 0–14)
DRVVT: 35.6 s (ref 0.0–47.0)
PHOSPHATYDALSERINE, IGA: 2 {APS'U} (ref 0–20)
PHOSPHATYDALSERINE, IGG: 2 {GPS'U} (ref 0–11)
PHOSPHATYDALSERINE, IGM: 8 {MPS'U} (ref 0–25)
PTT LA: 34.4 s (ref 0.0–51.9)

## 2016-12-06 DIAGNOSIS — E78019 Familial hypercholesterolemia, unspecified: Secondary | ICD-10-CM

## 2016-12-06 DIAGNOSIS — E785 Hyperlipidemia, unspecified: Secondary | ICD-10-CM

## 2016-12-06 DIAGNOSIS — E7801 Familial hypercholesterolemia: Secondary | ICD-10-CM

## 2016-12-06 DIAGNOSIS — R7303 Prediabetes: Secondary | ICD-10-CM

## 2016-12-08 ENCOUNTER — Ambulatory Visit (INDEPENDENT_AMBULATORY_CARE_PROVIDER_SITE_OTHER): Payer: BLUE CROSS/BLUE SHIELD | Admitting: Internal Medicine

## 2016-12-08 DIAGNOSIS — E785 Hyperlipidemia, unspecified: Secondary | ICD-10-CM | POA: Diagnosis not present

## 2016-12-08 DIAGNOSIS — Z7982 Long term (current) use of aspirin: Secondary | ICD-10-CM

## 2016-12-08 DIAGNOSIS — I69392 Facial weakness following cerebral infarction: Secondary | ICD-10-CM | POA: Diagnosis not present

## 2016-12-08 DIAGNOSIS — I69320 Aphasia following cerebral infarction: Secondary | ICD-10-CM

## 2016-12-08 DIAGNOSIS — I675 Moyamoya disease: Secondary | ICD-10-CM

## 2016-12-08 DIAGNOSIS — F17211 Nicotine dependence, cigarettes, in remission: Secondary | ICD-10-CM | POA: Diagnosis not present

## 2016-12-08 DIAGNOSIS — I7771 Dissection of carotid artery: Secondary | ICD-10-CM

## 2016-12-08 DIAGNOSIS — R7303 Prediabetes: Secondary | ICD-10-CM

## 2016-12-08 DIAGNOSIS — E7800 Pure hypercholesterolemia, unspecified: Secondary | ICD-10-CM

## 2016-12-08 DIAGNOSIS — Z87891 Personal history of nicotine dependence: Secondary | ICD-10-CM

## 2016-12-08 NOTE — Telephone Encounter (Signed)
Was seen for hfu today

## 2016-12-08 NOTE — Progress Notes (Signed)
CC: follow up for hospitalization for a stroke  HPI:  Manuel Boyer is a 26 y.o. 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   Past Medical History:  Diagnosis Date  . Internal carotid artery dissection (HCC)   . Moya moya disease     Past Surgical History:  Procedure Laterality Date  . IR ANGIO INTRA EXTRACRAN SEL COM CAROTID INNOMINATE BILAT MOD SED  11/28/2016  . IR ANGIO VERTEBRAL SEL VERTEBRAL BILAT MOD SED  11/28/2016  . RADIOLOGY WITH ANESTHESIA N/A 11/28/2016   Procedure: RADIOLOGY WITH ANESTHESIA;  Surgeon: Julieanne Cotton, MD;  Location: [facility] OR;  Service: Radiology;  Laterality: N/A;   Family history  Brother- CVA secondary to trauma suffered from a gunshot.   Review of Systems:  Please refer to HPI and Assessment and plans tab for pertinent review of systems, all others reviewed and negative.  Social hx: Smokes 3 PPD, denies alcohol or illicit drug use.   Past Surgical History:  Procedure Laterality Date  . IR ANGIO INTRA EXTRACRAN SEL COM CAROTID INNOMINATE BILAT MOD SED  11/28/2016  . IR ANGIO VERTEBRAL SEL VERTEBRAL BILAT MOD SED  11/28/2016  . RADIOLOGY WITH ANESTHESIA N/A 11/28/2016   Procedure: RADIOLOGY WITH ANESTHESIA;  Surgeon: Julieanne Cotton, MD;  Location: [facility] OR;  Service: Radiology;  Laterality: N/A;   Physical Exam:  Vitals:   12/08/16 1403  BP: (!) 144/64  Pulse: 65  Temp: 98.1 F (36.7 C)  TempSrc: Oral  SpO2: 100%  Weight: 236 lb 3.2 oz (107.1 kg)   Physical Exam  Constitutional: He is oriented to person, place, and time. He appears well-developed and well-nourished. No distress.  HENT:  Head: Normocephalic and atraumatic.  Eyes: Conjunctivae and EOM are normal. Right eye exhibits no discharge. Left eye exhibits no discharge. No scleral icterus.  Neck: Normal range of motion.  Cardiovascular: Normal rate and regular rhythm.   No murmur  heard. Pulmonary/Chest: Effort normal. No respiratory distress. He has no wheezes. He has no rales.  Abdominal: Soft. Bowel sounds are normal. He exhibits no distension. There is no tenderness. There is no guarding.  Neurological: He is alert and oriented to person, place, and time. No cranial nerve deficit or sensory deficit. He exhibits normal muscle tone. Coordination normal.  no facial droop, moderate word finding difficulties stable from the time of discharge  Strength in right hand 2/5, 5/5 bilateral elbow flexion and extension, left hand grip, bilateral hip flexion, knee flexion and extension, foot plantar and dorsiflexion  cerebellar finger to nose testing normal,  Skin: Skin is warm and dry. He is not diaphoretic.  Psychiatric: He has a normal mood and affect. His behavior is normal.   Assessment & Plan:   moya moya  History of stroke Word finding difficulties and right hand strength are stable from the time of discharge. His facial droop has improved. He now has difficulty chewing on the right side which is new from the time of discharge. Good compliance with aspirin and plavix which were started on last admission, reports no signs of bleeding or easy bruising. and his mother have a concern for the prognosis of this diagnosis and question if there is anything else that can be done. He has follow up scheduled in 5 weeks with Dr. Pearlean Brownie. They are open to referral to going to an academic center [place] of [place] with specialized focus for Moya if this will lessen his chance  of having another stroke.  -continue aspirin indefinitely  -plavix for 3 months total course ( stop date ~ september 26 ) -follow up lipid panel in one month to document improvement in LDL to 70 or less  -RTC in 1 month   Prediabetes Hemoglobin A1c 5.7 found on admission, this is diagnostic of prediabetes. [name] would like to focus on lifestyle modification such as healthier eating, exercise and weight loss. -Repeat  A1c in 4078m - 1 year if it is still within the pre diabetes range can consider starting metformin   Hyperlipidemia  LDL 105 he was started on lovastatin for LDL goal <70.  - Repeat lipid panel in one month   See Encounters Tab for problem based charting.  Patient discussed with Dr. Heide SparkNarendra

## 2016-12-08 NOTE — Patient Instructions (Addendum)
[name]  Please continue taking the aspirin and Plavix You can stop taking the aspirin around the end of September Please schedule a follow-up appointment to see me in 2 month  Call the clinic if you have any new concerns

## 2016-12-10 ENCOUNTER — Encounter: Payer: Self-pay | Admitting: Internal Medicine

## 2016-12-10 DIAGNOSIS — Z87891 Personal history of nicotine dependence: Secondary | ICD-10-CM | POA: Insufficient documentation

## 2016-12-10 DIAGNOSIS — I675 Moyamoya disease: Secondary | ICD-10-CM | POA: Insufficient documentation

## 2016-12-10 DIAGNOSIS — I7771 Dissection of carotid artery: Secondary | ICD-10-CM | POA: Insufficient documentation

## 2016-12-10 NOTE — Assessment & Plan Note (Signed)
LDL 105 he was started on lovastatin for LDL goal <70.  - Repeat lipid panel in one month

## 2016-12-10 NOTE — Assessment & Plan Note (Signed)
Word finding difficulties and right hand strength are stable from the time of discharge. His facial droop has improved. He now has difficulty chewing on the right side which is new from the time of discharge. Good compliance with aspirin and plavix which were started on last admission, reports no signs of bleeding or easy bruising. and his mother have a concern for the prognosis of this diagnosis and question if there is anything else that can be done. He has follow up scheduled in 5 weeks with Dr. Pearlean BrownieSethi. They are open to referral to going to an academic center [place] of state with specialized focus for Moya if this will lessen his chance of having another stroke.  -continue aspirin indefinitely  -plavix for 3 months total course ( stop date ~ september 26 ) -follow up lipid panel in one month to document improvement in LDL to 70 or less  -RTC in 1 month

## 2016-12-10 NOTE — Assessment & Plan Note (Signed)
Hemoglobin A1c 5.7 found on admission, this is diagnostic of prediabetes. [name] would like to focus on lifestyle modification such as healthier eating, exercise and weight loss. -Repeat A1c in 690m - 1 year if it is still within the pre diabetes range can consider starting metformin

## 2016-12-10 NOTE — Assessment & Plan Note (Signed)
Prior to hospitalization for stroke he was smoking 3 packs per day. Since the hospitalization he has quit cold [place]. He has had good support from family at home and does not want to try smoking cessation aids at this time.  - continue to monitor

## 2016-12-11 NOTE — Progress Notes (Signed)
Internal Medicine Clinic Attending  Case discussed with Dr. Blum at the time of the visit.  We reviewed the resident's history and exam and pertinent patient test results.  I agree with the assessment, diagnosis, and plan of care documented in the resident's note. 

## 2016-12-13 ENCOUNTER — Ambulatory Visit: Payer: BLUE CROSS/BLUE SHIELD | Attending: Internal Medicine | Admitting: Occupational Therapy

## 2016-12-13 ENCOUNTER — Ambulatory Visit: Payer: BLUE CROSS/BLUE SHIELD | Admitting: Speech Pathology

## 2016-12-13 ENCOUNTER — Encounter: Payer: Self-pay | Admitting: Occupational Therapy

## 2016-12-13 DIAGNOSIS — R278 Other lack of coordination: Secondary | ICD-10-CM

## 2016-12-13 DIAGNOSIS — R41841 Cognitive communication deficit: Secondary | ICD-10-CM | POA: Insufficient documentation

## 2016-12-13 DIAGNOSIS — I69351 Hemiplegia and hemiparesis following cerebral infarction affecting right dominant side: Secondary | ICD-10-CM

## 2016-12-13 DIAGNOSIS — R4701 Aphasia: Secondary | ICD-10-CM | POA: Insufficient documentation

## 2016-12-13 DIAGNOSIS — R482 Apraxia: Secondary | ICD-10-CM

## 2016-12-13 DIAGNOSIS — I69318 Other symptoms and signs involving cognitive functions following cerebral infarction: Secondary | ICD-10-CM | POA: Diagnosis not present

## 2016-12-13 DIAGNOSIS — M6281 Muscle weakness (generalized): Secondary | ICD-10-CM | POA: Diagnosis present

## 2016-12-13 NOTE — Therapy (Signed)
[facility] [address] [place], [place], [address] Phone: [phone]   Fax:  [phone]  Occupational Therapy Evaluation  Patient Details  Name: Manuel Boyer MRN: [id number] Date of Birth: 05/15/1991 Referring Provider: Dr. Carlynn PurlErik Hoffman  Encounter Date: 12/13/2016      OT End of Session - 12/13/16 1734    Visit Number 1   Number of Visits 16   Date for OT Re-Evaluation 02/07/17   Authorization Type [employer] 35 visits for OT   Authorization - Visit Number 1   Authorization - Number of Visits 35   OT Start Time 1450   OT Stop Time 1529   OT Time Calculation (min) 39 min   Activity Tolerance Patient tolerated treatment well      Past Medical History:  Diagnosis Date  . Internal carotid artery dissection (HCC)   . Moya moya disease     Past Surgical History:  Procedure Laterality Date  . IR ANGIO INTRA EXTRACRAN SEL COM CAROTID INNOMINATE BILAT MOD SED  11/28/2016  . IR ANGIO VERTEBRAL SEL VERTEBRAL BILAT MOD SED  11/28/2016  . RADIOLOGY WITH ANESTHESIA N/A 11/28/2016   Procedure: RADIOLOGY WITH ANESTHESIA;  Surgeon: Julieanne Cottoneveshwar, Sanjeev, MD;  Location: [facility] OR;  Service: Radiology;  Laterality: N/A;    There were no vitals filed for this visit.      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 No/denies           [facility] - 12/13/16 0001      Assessment   Diagnosis L CVA   Referring Provider Dr. Carlynn PurlErik Hoffman   Onset Date 11/28/16   Prior Therapy Acute PT,OT ,      Precautions   Precautions Fall     Restrictions   Weight Bearing Restrictions No     Balance Screen   Has the patient fallen in the past 6 months No     Home  Environment    Family/patient expects to be discharged to: Private residence   Living Arrangements Spouse/significant other  3 children, mother in law   Available Help at Discharge Available 24 hours/day   Type of Home House   Home Layout One level  with basement that pt did access   Bathroom Shower/Tub Tub/Shower unit   [employer]ied [employer] Industries[employer]athroom Toilet Standard   Additional Comments Pt has no equipment in bathroom except a grab bar in and one outside the shower that came with the house.     Prior Function   Level of Independence Independent   Vocation Full time employment   Vocation Requirements driving buses for [employer]   Leisure relax, music, media, making flyers, using the computer     ADL   Eating/Feeding Minimal assistance  uisng non dominant hand; assist to cut   Grooming Minimal assistance  pt hasn't tried shaving yet   Upper Body Bathing Minimal assistance  for left arm   Lower Body Bathing Supervision/safety   Upper Body Dressing Increased time   Lower Body Dressing Minimal assistance  to tie only however pt wore shoes untied before   Toilet Transfer Modified independent   Toileting - Clothing Manipulation Modified independent   Toileting -  Hygiene  Modified Independent   Tub/Shower Transfer Supervision/safety   ADL comments Pt only has grab bars around shower area     IADL   Shopping Needs to be accompanied on any shopping trip   Light Housekeeping Performs light daily tasks such as dishwashing, bed making   Meal Prep Needs to have meals prepared and served   [employer] on family or friends for transportation   Medication Management Is responsible for taking medication in correct dosages at correct time   Development worker, community financial matters independently (budgets, writes checks, pays rent, bills goes to bank), collects and keeps track of income     Mobility   Mobility Status Needs assist   Mobility Status Comments Wife supervising in community however pt reports he  walked on the grass and did not notice a change in his balance.      Written Expression   Dominant Hand Right   Handwriting 50% legible  printing first name     Vision - History   Baseline Vision Wears glasses all the time     Vision Assessment   Eye Alignment Impaired (comment)  mild pt denies any visual changes   Ocular Range of Motion Within Functional Limits   Tracking/Visual Pursuits Able to track stimulus in all quads without difficulty   Visual Fields No apparent deficits     Activity Tolerance   Activity Tolerance Tolerate 30+ min activity without fatigue     Cognition   Overall Cognitive Status Impaired/Different from baseline   Mini Mental State Exam  Will further assess cognition via functional tasks.    Memory Impaired  ST memory for daily details     Sensation   Light Touch Appears Intact   Hot/Cold Appears Intact   Proprioception Appears Intact     Coordination   Gross Motor Movements are Fluid and Coordinated Yes   Fine Motor Movements are Fluid and Coordinated No   9 Hole Peg Test Right   Right 9 Hole Peg Test unable to pick any up. Impacted by AROM and strength but feel pt is also apraxic in use of R hand.      Edema   Edema moderate edema in fingers and hand     Tone   Assessment Location Right Upper Extremity     ROM / Strength   AROM / PROM / Strength AROM;Strength     AROM   Overall AROM  Deficits   Overall AROM Comments Pt with full AROM except in hand pt has 75% index finger flexion, 90% composite flexion in R hand.      Strength   Overall Strength Deficits   Overall Strength Comments R grip strength see below all other strength in UE's WFL's.      Hand Function   Right Hand Gross Grasp Impaired   Right Hand Grip (lbs) 0   Left Hand Gross Grasp Functional   Left Hand Grip (lbs) 70                           OT Short Term Goals - 12/13/16 1721      OT SHORT TERM GOAL #1   Title Pt will be mod I with HEP- 8/82018    Status New     OT SHORT TERM GOAL #2   Title Pt will be mod I with bathing   Status New     OT SHORT TERM GOAL #3   Title Pt  will be mod I with shower transfers   Status New     OT SHORT TERM GOAL #4   Title Pt will be mod I using electric razor   Status New     OT SHORT TERM GOAL #5   Title Pt will feed self at least 50% of meals with RUE with AE prn   Status New     OT SHORT TERM GOAL #6   Title Pt will be able to write first name legibly using AE prn   Status New     OT SHORT TERM GOAL #7   Title Pt will use RUE as gross assist at least 25% of the time  for simple familiar hot meal prep and require no more than supervision    Status New           OT Long Term Goals - 12/13/16 1724      OT LONG TERM GOAL #1   Title Pt will be mod I with upgraded HEP- 02/07/2017   Status New     OT LONG TERM GOAL #2   Title Pt will eat 100% of meals with RUE with AE prn   Status New     OT LONG TERM GOAL #3   Title Pt will use RUE as gross assist at least 50% of the time for hot meal prep and will be mod I   Status New     OT LONG TERM GOAL #4   Title Pt will be able to play simple song on drums using both hands   Status New     OT LONG TERM GOAL #5   Title Pt and family will verbalize understanding of driving eval recommendations.    Status New     OT LONG TERM GOAL #6   Title Pt will demonstrate ability to write full name with 100% legiblity with AE prn   Status New     OT LONG TERM GOAL #7   Title Pt will be able to type short email with no more than 3 errors.    Status New     OT LONG TERM GOAL #8   Title Pt will demonstate at least 10 pounds of grip strength to assist with functional tasks   Status New               Plan - 12/13/16 1729    Clinical Impression Statement Pt is a 26 year old male s/p L CVA on 11/28/2016. Pt with vertebral artery occlusions and diagnosed with moya moya.  Pt discharged home n 12/01/2016.  Pt presents today with the following  that impacts ADL, IADL, leisure and return to work:  apraxia, aphasia, ST memory deficits, R hemiplegia, decreased functional use of RUE.  Pt will benefit from skilled OT to address these deficits and maximize independence.   Occupational Profile and client history currently impacting functional performance no significant PMH    Occupational performance deficits (Please refer to evaluation for details): ADL's;IADL's;Work;Leisure;Social Participation   Rehab Potential Good   Current Impairments/barriers affecting progress: aphasis and apraxia   OT Frequency 2x / week   OT Duration 8 weeks   OT Treatment/Interventions Self-care/ADL training;Electrical Stimulation;Therapeutic exercise;Neuromuscular education;DME and/or AE instruction;Passive range of motion;Manual Therapy;Splinting;Therapeutic activities;Patient/family education   Plan initiate HEP if possible, grasp and release, address adapted eating   Consulted and Agree with Plan of Care Patient      Patient will benefit from skilled therapeutic intervention in order to improve  the following deficits and impairments:  Decreased cognition, Decreased coordination, Decreased range of motion, Decreased knowledge of use of DME, Decreased strength, Impaired UE functional use, Other (comment) (apraxia)  Visit Diagnosis: Hemiplegia and hemiparesis following cerebral infarction affecting right dominant side (HCC) - Plan: Ot plan of care cert/re-cert  Apraxia - Plan: Ot plan of care cert/re-cert  Muscle weakness (generalized) - Plan: Ot plan of care cert/re-cert  Other lack of coordination - Plan: Ot plan of care cert/re-cert  Other symptoms and signs involving cognitive functions following cerebral infarction - Plan: Ot plan of care cert/re-cert    Problem List Patient Active Problem List   Diagnosis Date Noted  . Internal carotid artery dissection (HCC) 12/10/2016  . Moya moya disease 12/10/2016  . History of tobacco use 12/10/2016  .  Prediabetes 12/06/2016  . Hyperlipidemia 12/06/2016  . CVA (cerebral vascular accident) (HCC) 11/28/2016    Norton Pastel, OTR/L 12/13/2016, 5:38 PM  [facility] [address] [place], [place], [address] Phone: [phone]   Fax:  [phone]  Name: Manuel Boyer MRN: [id number] Date of Birth: Oct 02, 1990

## 2016-12-15 NOTE — Therapy (Signed)
[facility] [address] [place], [place], [address] Phone: [phone]   Fax:  [phone]  Speech Language Pathology Evaluation  Patient Details  Name: Manuel Boyer MRN: [id number] Date of Birth: May 16, 1991 Referring Provider: Gust Rung, DO  Encounter Date: 12/13/2016    Past Medical History:  Diagnosis Date  . Internal carotid artery dissection (HCC)   . Moya moya disease     Past Surgical History:  Procedure Laterality Date  . IR ANGIO INTRA EXTRACRAN SEL COM CAROTID INNOMINATE BILAT MOD SED  11/28/2016  . IR ANGIO VERTEBRAL SEL VERTEBRAL BILAT MOD SED  11/28/2016  . RADIOLOGY WITH ANESTHESIA N/A 11/28/2016   Procedure: RADIOLOGY WITH ANESTHESIA;  Surgeon: Julieanne Cotton, MD;  Location: [facility] OR;  Service: Radiology;  Laterality: N/A;    There were no vitals filed for this visit.       12/13/16 1410  SLP Visit Information  SLP Received On 12/13/16  Referring Provider Gust Rung, DO  Onset Date 11/28/16  Medical Diagnosis Cerebrovascular accident (CVA) due to thrombosis of vertebral artery, unspecified blood vessel laterality  Pain Assessment  Pain Score 6  Pain Location Arm  Pain Orientation Right  Pain Type Acute pain  Pain Frequency Intermittent  Currently in Pain? Yes  General Information  HPI Pt is a 26 y.o. male with PMH of tobacco use, hyperglycemia, genital herpes, and vitamin D deficiency who presented to ED on 6/26 with pain in his right arm and left hand, slurred speechand right hand weakness which began the previous Wednesday and worsened over the last 2 days. Reportedly with expressive aphasia with some improvements since admission. Head CT showed Hypodensity left frontal lobe most compatible with subacute infarct. This extends into the left basal ganglia. OT eval noted deficits with memory and problem solving. Acute SLP evaluation noted moderate aphasia, with greater expressive vs  receptive deficits.   Behavioral/Cognition alert, cooperative  Mobility Status ambulated to session  Prior Functional Status  Cognitive/Linguistic Baseline WFL  Type of Home House  Lives With Spouse  Available Support Family  Education bachelor's degree  Vocation On disability  Cognition  Overall Cognitive Status Difficult to assess  Difficult to assess due to Impaired communication  Memory Impaired  Auditory Comprehension  Overall Auditory Comprehension Impaired  Yes/No Questions X  Complex Questions 50-74% accurate  Commands WFL  Two Step Basic Commands 75-100% accurate  Multistep Basic Commands 75-100% accurate  Conversation Moderately complex  EffectiveTechniques Repetition  Visual Recognition/Discrimination  Discrimination WFL  Reading Comprehension  Reading Status X  Word level 76-100% accurate  Sentence Level 51-75% accurate  Paragraph Level 51-75% accurate  Functional Environmental (signs, name badge) Not tested  Effective Techniques Verbal cueing  Expression  Primary Mode of Expression Verbal  Verbal Expression  Overall Verbal Expression Impaired  Initiation No impairment  Automatic Speech Name;Social Response;Counting;Day of week  Level of Generative/Spontaneous Verbalization Sentence  Repetition Impaired  Level of Impairment Word level  Naming Impairment  Responsive 76-100% accurate  Confrontation 50-74% accurate  Convergent Not tested  Divergent Not tested  Verbal Errors Neologisms;Phonemic paraphasias;Semantic paraphasias;Perseveration;Aware of errors  Pragmatics No impairment  Effective Techniques Sentence completion;Articulatory cues;Semantic cues  Non-Verbal Means of Communication Not applicable  Written Expression  Dominant Hand Right  Written Expression X  Self Formulation Ability Phrase  Oral Motor/Sensory Function  Overall Oral Motor/Sensory Function Impaired  Labial ROM WFL  Labial Symmetry WFL  Labial Strength WFL  Labial Sensation  Reduced Right  Labial  Coordination WFL  Lingual ROM WFL  Lingual Symmetry WFL  Lingual Strength Within Functional Limits  Lingual Sensation Within Functional Limits  Lingual Coordination WFL  Facial ROM WFL  Facial Symmetry WFL  Facial Strength WFL  Facial Sensation Reduced  Facial Coordination WFL  Velum WFL  Mandible WFL  Overall Oral Motor/Sensory Function (minimal impairment)  Motor Speech  Overall Motor Speech Appears within functional limits for tasks assessed  Respiration WFL  Phonation Normal  Resonance [facility]  Articulation [facility]  Motor Speech Errors Aware  Standardized Assessments  Standardized Assessments  [place] Diagnostic Apashia Examiniation-3rd  [place] Diagnostic Apashia Examiniation-3rd edition  see clinical impressions  Individuals Consulted  Consulted and Agree with Results and Recommendations Patient;Family member/caregiver  Family Member Consulted  mother       12/13/16 1415  SLP Visits / Re-Eval  Visit Number 1  Number of Visits 24  Date for SLP Re-Evaluation 02/09/17  SLP Time Calculation  SLP Start Time 1400  SLP Stop Time  1445  SLP Time Calculation (min) 45 min  SLP - End of Session  Activity Tolerance Patient tolerated treatment well        12/13/16 1415  SLP Assessment/Plan  Clinical Impression Statement Patient presents with impaired expressive and receptive language with greater deficits in expressive language. Suspect underlying cognitive communication deficits which are difficult to assess given language impairment; will further assess cognition in subsequent sessions. Administered portions of [place] Diagnostic Aphasia examination. Patient's speech noted with agrammatism, neologisms, phonemic (chuthbrush/toothbrush) and semantic paraphasias (squirrel/beaver, horse/unicorn), circumlocutions, anomia, and occasional perseverations. Confrontation naming 11/15, complex ideational material 8/10, picture word ID 2/4, oral reading comprehension 5/7.  Recommend skilled ST to improve receptive and expressive langauage for possible return to workforce and to improve quality of life, interactions with family, friends and the community.  Speech Therapy Frequency 3x / week (consider decreasing to 2x a week to extend duration of ST)  Duration (8 weeks or 24 visits)  Treatment/Interventions Cueing hierarchy;Functional tasks;Patient/family education;Multimodal communcation approach;Cognitive reorganization;Language facilitation;Internal/external aids;SLP instruction and feedback;Compensatory techniques  Potential to Achieve Goals Good  Consulted and Agree with Plan of Care Patient;Family member/caregiver                      SLP Education - 12/13/16 1415   Education provided Yes   Education Details results of assessment, plan of care   Person(s) Educated Patient;Parent(s)   Methods Explanation   Comprehension Verbalized understanding;Need further instruction          SLP Short Term Goals - 12/13/16 1415     SLP SHORT TERM GOAL #1   Title Pt will name 8 items for simple categories with occasional min A.   Time 4   Period Weeks   Status New     SLP SHORT TERM GOAL #2   Title pt will demo understanding of 10 minutes of simple-mod complex conversation over two sessions   Time 4   Period Weeks   Status New     SLP SHORT TERM GOAL #3   Title pt will generate simple sentences responding to therapy stimuli with of average 5 words over two sessions   Time 4   Period Weeks   Status New     SLP SHORT TERM GOAL #4   Title Pt will participate in assessment of cognitive communication within first 4 sessions.   Time 4   Period Weeks   Status New  SLP Long Term Goals - 12/13/16 1415      SLP LONG TERM GOAL #1   Title Pt will utilize compensations for aphasia 80% of opportunities in simple conversation with occasional min A   Time 8   Period Weeks   Status New     SLP LONG TERM GOAL #2   Title pt will  participate functionally in a 10 minute simple conversation in 2 sessions   Time 8   Period Weeks   Status New     SLP LONG TERM GOAL #3   Title Pt will demo understanding of 8 minutes simple conversation with repeats allowed x2.   Time 8   Period Weeks   Status New        Patient will benefit from skilled therapeutic intervention in order to improve the following deficits and impairments:   Aphasia  Cognitive communication deficit    Problem List Patient Active Problem List   Diagnosis Date Noted  . Internal carotid artery dissection (HCC) 12/10/2016  . Moya moya disease 12/10/2016  . History of tobacco use 12/10/2016  . Prediabetes 12/06/2016  . Hyperlipidemia 12/06/2016  . CVA (cerebral vascular accident) (HCC) 11/28/2016   Rondel BatonMary Beth Andrzej Scully, MS, CCC-SLP Speech-Language Pathologist  Arlana LindauMary E Barton Want 12/15/2016, 8:05 AM  [facility] [address] [place], [place], [address] Phone: [phone]   Fax:  [phone]  Name: Manuel Boyer MRN: [id number] Date of Birth: [birth year]

## 2016-12-15 NOTE — Progress Notes (Signed)
   12/13/16 1408  Symptoms/Limitations  Subjective "I had a stroke and I guess I'm here."   Patient is accompained by: Family member (Mom Eagle RiverGlenda)

## 2016-12-18 ENCOUNTER — Ambulatory Visit: Payer: BLUE CROSS/BLUE SHIELD | Admitting: Occupational Therapy

## 2016-12-18 ENCOUNTER — Encounter: Payer: Self-pay | Admitting: Occupational Therapy

## 2016-12-18 ENCOUNTER — Ambulatory Visit: Payer: BLUE CROSS/BLUE SHIELD | Admitting: Speech Pathology

## 2016-12-18 DIAGNOSIS — I69351 Hemiplegia and hemiparesis following cerebral infarction affecting right dominant side: Secondary | ICD-10-CM

## 2016-12-18 DIAGNOSIS — I69318 Other symptoms and signs involving cognitive functions following cerebral infarction: Secondary | ICD-10-CM

## 2016-12-18 DIAGNOSIS — R482 Apraxia: Secondary | ICD-10-CM

## 2016-12-18 DIAGNOSIS — M6281 Muscle weakness (generalized): Secondary | ICD-10-CM

## 2016-12-18 DIAGNOSIS — R41841 Cognitive communication deficit: Secondary | ICD-10-CM

## 2016-12-18 DIAGNOSIS — R278 Other lack of coordination: Secondary | ICD-10-CM

## 2016-12-18 DIAGNOSIS — R4701 Aphasia: Secondary | ICD-10-CM

## 2016-12-18 NOTE — Therapy (Signed)
[facility] [address] [place], [place], [address] Phone: [phone]   Fax:  [phone]  Speech Language Pathology Treatment  Patient Details  Name: Manuel Boyer MRN: [id number] Date of Birth: June 08, 1990 Referring Provider: Gust RungHoffman, Erik C, DO  Encounter Date: 12/18/2016      End of Session - 12/18/16 1945    Visit Number 2   Number of Visits 24   Date for SLP Re-Evaluation 02/09/17   SLP Start Time 1146   SLP Stop Time  1233   SLP Time Calculation (min) 47 min   Activity Tolerance Patient tolerated treatment well      Past Medical History:  Diagnosis Date  . Internal carotid artery dissection (HCC)   . Moya moya disease     Past Surgical History:  Procedure Laterality Date  . IR ANGIO INTRA EXTRACRAN SEL COM CAROTID INNOMINATE BILAT MOD SED  11/28/2016  . IR ANGIO VERTEBRAL SEL VERTEBRAL BILAT MOD SED  11/28/2016  . RADIOLOGY WITH ANESTHESIA N/A 11/28/2016   Procedure: RADIOLOGY WITH ANESTHESIA;  Surgeon: Julieanne Cottoneveshwar, Sanjeev, MD;  Location: [facility] OR;  Service: Radiology;  Laterality: N/A;    There were no vitals filed for this visit.      Subjective Assessment - 12/18/16 1152    Subjective "I felt kinda good...some stuff I needed some help." re: HW   Currently in Pain? No/denies  right hand is tingling               ADULT SLP TREATMENT - 12/18/16 1153      General Information   Behavior/Cognition Alert;Cooperative;Pleasant mood   Patient Positioning Upright in chair     Treatment Provided   Treatment provided Cognitive-Linquistic     Cognitive-Linquistic Treatment   Treatment focused on Aphasia   Skilled Treatment SLP explained therapy goals and provided education re: compensations for aphasia. Reviewed pt's home exercises; he completed all assigned tasks. He required occasional mod A for naming items in a category vs. name of category. Provided training in multimodal  strategies/compensations for anomia including writing, drawing, and semantic description.     Assessment / Recommendations / Plan   Plan Continue with current plan of care     Progression Toward Goals   Progression toward goals Progressing toward goals          SLP Education - 12/18/16 1944    Education provided Yes   Education Details therapy goals and compensations for aphasia   Person(s) Educated Patient   Methods Explanation;Demonstration;Verbal cues   Comprehension Verbalized understanding;Returned demonstration;Need further instruction          SLP Short Term Goals - 12/18/16 1947      SLP SHORT TERM GOAL #1   Title Pt will name 8 items for simple categories with occasional min A.   Time 4   Period Weeks   Status On-going     SLP SHORT TERM GOAL #2   Title pt will demo understanding of 10 minutes of simple-mod complex conversation over two sessions   Time 4   Period Weeks   Status On-going     SLP SHORT TERM GOAL #3   Title pt will generate simple sentences responding to therapy stimuli with of average 5 words over two sessions   Time 4   Period Weeks   Status On-going     SLP SHORT TERM GOAL #4   Title Pt will participate in assessment of cognitive communication within first 4 sessions.   Time  4   Period Weeks   Status On-going          SLP Long Term Goals - 12/18/16 1948      SLP LONG TERM GOAL #1   Title Pt will utilize compensations for aphasia 80% of opportunities in simple conversation with occasional min A   Time 8   Period Weeks   Status On-going     SLP LONG TERM GOAL #2   Title pt will participate functionally in a 10 minute simple conversation in 2 sessions   Time 8   Period Weeks   Status On-going     SLP LONG TERM GOAL #3   Title Pt will demo understanding of 15 min simple to mod complex conversation with repeats allowed x2.   Time 8   Status Revised          Plan - 12/18/16 1945    Clinical Impression Statement Patient  presents with impaired expressive and receptive language with greater deficits in expressive language. Suspect underlying cognitive communication deficits which are difficult to assess given language impairment; will further assess cognition in subsequent sessions. He appears to be highly motivated and worked diligently at home to complete assigned tasks.Recommend skilled ST to improve receptive and expressive langauage for possible return to workforce and to improve quality of life, interactions with family, friends and the community.   Speech Therapy Frequency 3x / week   Treatment/Interventions Cueing hierarchy;Functional tasks;Patient/family education;Multimodal communcation approach;Cognitive reorganization;Language facilitation;Internal/external aids;SLP instruction and feedback;Compensatory techniques   Potential to Achieve Goals Good   SLP Home Exercise Plan reviewed and provided   Consulted and Agree with Plan of Care Patient;Family member/caregiver      Patient will benefit from skilled therapeutic intervention in order to improve the following deficits and impairments:   Aphasia  Cognitive communication deficit    Problem List Patient Active Problem List   Diagnosis Date Noted  . Internal carotid artery dissection (HCC) 12/10/2016  . Moya moya disease 12/10/2016  . History of tobacco use 12/10/2016  . Prediabetes 12/06/2016  . Hyperlipidemia 12/06/2016  . CVA (cerebral vascular accident) (HCC) 11/28/2016   [name], MS, CCC-SLP Speech-Language Pathologist  [name] 12/18/2016, 7:50 PM  [name] [address] [place], [place], [address] Phone: [phone]   Fax:  [phone]   Name: Manuel Boyer MRN: [id number] Date of Birth: 05/23/1991

## 2016-12-18 NOTE — Therapy (Signed)
[facility] [address] [place], [place], [address] Phone: [phone]   Fax:  [phone]  Occupational Therapy Treatment  Patient Details  Name: Noe GensJamar Blackburn MRN: [id number] Date of Birth: [birth year] Referring Provider: Dr. Carlynn PurlErik Hoffman  Encounter Date: 12/18/2016      OT End of Session - 12/18/16 1345    Visit Number 2   Number of Visits 16   Date for OT Re-Evaluation 02/07/17   Authorization Type [employer] 35 visits for OT   Authorization - Visit Number 2   Authorization - Number of Visits 35   OT Start Time 1230   OT Stop Time 1315   OT Time Calculation (min) 45 min   Activity Tolerance Patient tolerated treatment well   Behavior [facility] for tasks assessed/performed      Past Medical History:  Diagnosis Date  . Internal carotid artery dissection (HCC)   . Moya moya disease     Past Surgical History:  Procedure Laterality Date  . IR ANGIO INTRA EXTRACRAN SEL COM CAROTID INNOMINATE BILAT MOD SED  11/28/2016  . IR ANGIO VERTEBRAL SEL VERTEBRAL BILAT MOD SED  11/28/2016  . RADIOLOGY WITH ANESTHESIA N/A 11/28/2016   Procedure: RADIOLOGY WITH ANESTHESIA;  Surgeon: Julieanne Cottoneveshwar, Sanjeev, MD;  Location: [facility] OR;  Service: Radiology;  Laterality: N/A;    There were no vitals filed for this visit.      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 No/denies   Pain Score 0-No pain                      OT Treatments/Exercises (OP) - 12/18/16 0001      ADLs   ADL Comments Reviewed short and long term goals with patient.       Neurological Re-education Exercises   Other Exercises 1 Worked with patient to develop motor patterns in hand for  simple functional tasks.  Worked to improve grading, and awareness of especially thumb and index finger movement in right versus left hand.  Initiated HEP for fine motor coordination, only started with two exercises, with intent to continually build on this program.     Other Grasp and Release Exercises  Worked with yellow therapy putty to address grasp and in hand manipulation patterns.  Not yet ready for home program.     Other Grasp and Release Exercises  Patient able to demonstrate 10 lb grip strength in right hand x 2 today.                  OT Education - 12/18/16 1344    Education provided Yes   Education Details Initiated coordination program for right hand   Person(s) Educated Patient   Methods Explanation;Demonstration;Tactile cues;Verbal cues;Handout   Comprehension Returned demonstration;Need further instruction          OT Short Term Goals - 12/18/16 1347      OT SHORT TERM GOAL #1   Title Pt will be mod I with HEP- 8/82018   Status On-going     OT SHORT TERM GOAL #2   Title Pt will be mod I with bathing   Status On-going  OT SHORT TERM GOAL #3   Title Pt will be mod I with shower transfers   Status On-going     OT SHORT TERM GOAL #4   Title Pt will be mod I using electric razor   Status On-going     OT SHORT TERM GOAL #5   Title Pt will feed self at least 50% of meals with RUE with AE prn   Status On-going     OT SHORT TERM GOAL #6   Title Pt will be able to write first name legibly using AE prn   Status On-going     OT SHORT TERM GOAL #7   Title Pt will use RUE as gross assist at least 25% of the time  for simple familiar hot meal prep and require no more than supervision    Status On-going           OT Long Term Goals - 12/18/16 1347      OT LONG TERM GOAL #1   Title Pt will be mod I with upgraded HEP- 02/07/2017   Status On-going     OT LONG TERM GOAL #2   Title Pt will eat 100% of meals with RUE with AE prn   Status On-going      OT LONG TERM GOAL #3   Title Pt will use RUE as gross assist at least 50% of the time for hot meal prep and will be mod I   Status On-going     OT LONG TERM GOAL #4   Title Pt will be able to play simple song on drums using both hands   Status On-going     OT LONG TERM GOAL #5   Title Pt and family will verbalize understanding of driving eval recommendations.    Status On-going     OT LONG TERM GOAL #6   Title Pt will demonstrate ability to write full name with 100% legiblity with AE prn   Status On-going     OT LONG TERM GOAL #7   Title Pt will be able to type short email with no more than 3 errors.    Status On-going     OT LONG TERM GOAL #8   Title Pt will demonstate at least 10 pounds of grip strength to assist with functional tasks   Status On-going               Plan - 12/18/16 1345    Clinical Impression Statement Patient is very motivated for improved functional use of right hand.  Patient agrees with all short and long term OT goals.     Rehab Potential Good   Current Impairments/barriers affecting progress: aphasis and apraxia   OT Frequency 2x / week   OT Duration 8 weeks   OT Treatment/Interventions Self-care/ADL training;Electrical Stimulation;Therapeutic exercise;Neuromuscular education;DME and/or AE instruction;Passive range of motion;Manual Therapy;Splinting;Therapeutic activities;Patient/family education   Plan grasp, release, in hand manipulation, functional tasks - eating, simple cooking   OT Home Exercise Plan Initiated today - coordination RUE   Consulted and Agree with Plan of Care Patient      Patient will benefit from skilled therapeutic intervention in order to improve the following deficits and impairments:  Decreased cognition, Decreased coordination, Decreased range of motion, Decreased knowledge of use of DME, Decreased strength, Impaired UE functional use, Other (comment)  Visit Diagnosis: Hemiplegia and hemiparesis following cerebral  infarction affecting right dominant side (HCC)  Apraxia  Muscle weakness (generalized)  Other lack of coordination  Other symptoms and signs involving cognitive functions following cerebral infarction    Problem List Patient Active Problem List   Diagnosis Date Noted  . Internal carotid artery dissection (HCC) 12/10/2016  . Moya moya disease 12/10/2016  . History of tobacco use 12/10/2016  . Prediabetes 12/06/2016  . Hyperlipidemia 12/06/2016  . CVA (cerebral vascular accident) [facility]) 11/28/2016    Collier Salina, OTR/L 12/18/2016, 1:49 PM   [facility] [address] [place], [place], [address] Phone: [phone]   Fax:  [phone]  Name: Zayan Delvecchio MRN: [id number] Date of Birth: 05-Nov-1990

## 2016-12-18 NOTE — Patient Instructions (Signed)
  Coordination Activities  Perform the following activities for up to 5 minutes 2-3 times per day with right hand(s).   Flip cards 1 at a time as fast as you can.  Pick up coins and place in container or coin bank.

## 2016-12-19 ENCOUNTER — Ambulatory Visit: Payer: BLUE CROSS/BLUE SHIELD | Admitting: Occupational Therapy

## 2016-12-19 ENCOUNTER — Encounter: Payer: Self-pay | Admitting: Occupational Therapy

## 2016-12-19 DIAGNOSIS — M6281 Muscle weakness (generalized): Secondary | ICD-10-CM

## 2016-12-19 DIAGNOSIS — I69318 Other symptoms and signs involving cognitive functions following cerebral infarction: Secondary | ICD-10-CM

## 2016-12-19 DIAGNOSIS — I69351 Hemiplegia and hemiparesis following cerebral infarction affecting right dominant side: Secondary | ICD-10-CM

## 2016-12-19 DIAGNOSIS — R482 Apraxia: Secondary | ICD-10-CM | POA: Diagnosis not present

## 2016-12-19 DIAGNOSIS — R278 Other lack of coordination: Secondary | ICD-10-CM

## 2016-12-19 NOTE — Therapy (Signed)
[facility] [address] [place], [place], [address] Phone: [phone]   Fax:  [phone]  Occupational Therapy Treatment  Patient Details  Name: Marvie Calender MRN: [id number] Date of Birth: [birth year] Referring Provider: Dr. Carlynn Purl  Encounter Date: 12/19/2016      OT End of Session - 12/19/16 1702    Visit Number 3   Number of Visits 16   Date for OT Re-Evaluation 02/07/17   Authorization Type [employer] 35 visits for OT   Authorization - Visit Number 3   Authorization - Number of Visits 35   OT Start Time 1535   OT Stop Time 1615   OT Time Calculation (min) 40 min   Activity Tolerance Patient tolerated treatment well   Behavior During Therapy [facility] for tasks assessed/performed      Past Medical History:  Diagnosis Date  . Internal carotid artery dissection (HCC)   . Moya moya disease     Past Surgical History:  Procedure Laterality Date  . IR ANGIO INTRA EXTRACRAN SEL COM CAROTID INNOMINATE BILAT MOD SED  11/28/2016  . IR ANGIO VERTEBRAL SEL VERTEBRAL BILAT MOD SED  11/28/2016  . RADIOLOGY WITH ANESTHESIA N/A 11/28/2016   Procedure: RADIOLOGY WITH ANESTHESIA;  Surgeon: Julieanne Cotton, MD;  Location: [facility] OR;  Service: Radiology;  Laterality: N/A;    There were no vitals filed for this visit.      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 No/denies   Pain Score 0-No pain                      OT Treatments/Exercises (OP) - 12/19/16 0001      Neurological Re-education Exercises   Other Exercises 1 Focused on functional use of right hand, for cutting meat, and use of fork.  Initially patient needed cueing to maintain tension in fingers  while moving any other part of his right arm; e.g. He would lose hold of fork when bringing food toward mouth.  With repetition and intermittent cueing patient able to sustain sufficient force on fork with built up handle for self feeding task.  Used coban wrap to help patient intially maintain thumb in opposition pattern to aide with picking up sequentially smaller items.  PAtient better able to oppose to index and long fingertip after coban wrap removed.     Other Exercises 2 Worked on hand strength with putty, with gripper on lightest setting and min assist hand over hand.  Patient able to successfully hold a 1 and 2lb weight in right hand to complete wrist exercises without difficulty.                  OT Education - 12/19/16 1701    Education provided Yes   Education Details need to incorportae right hand into ADL/IADL, e.g. washing dishes, buttoning shorts, picking up smaller items    Person(s) Educated Patient   Methods Explanation;Demonstration;Verbal cues   Comprehension Verbalized understanding;Need further instruction          OT Short Term Goals - 12/18/16 1347      OT SHORT TERM GOAL #1   Title Pt will be mod I with HEP- 8/82018  Status On-going     OT SHORT TERM GOAL #2   Title Pt will be mod I with bathing   Status On-going     OT SHORT TERM GOAL #3   Title Pt will be mod I with shower transfers   Status On-going     OT SHORT TERM GOAL #4   Title Pt will be mod I using electric razor   Status On-going     OT SHORT TERM GOAL #5   Title Pt will feed self at least 50% of meals with RUE with AE prn   Status On-going     OT SHORT TERM GOAL #6   Title Pt will be able to write first name legibly using AE prn   Status On-going     OT SHORT TERM GOAL #7   Title Pt will use RUE as gross assist at least 25% of the time  for simple familiar hot meal prep and require no more than supervision    Status On-going           OT Long Term Goals - 12/18/16 1347       OT LONG TERM GOAL #1   Title Pt will be mod I with upgraded HEP- 02/07/2017   Status On-going     OT LONG TERM GOAL #2   Title Pt will eat 100% of meals with RUE with AE prn   Status On-going     OT LONG TERM GOAL #3   Title Pt will use RUE as gross assist at least 50% of the time for hot meal prep and will be mod I   Status On-going     OT LONG TERM GOAL #4   Title Pt will be able to play simple song on drums using both hands   Status On-going     OT LONG TERM GOAL #5   Title Pt and family will verbalize understanding of driving eval recommendations.    Status On-going     OT LONG TERM GOAL #6   Title Pt will demonstrate ability to write full name with 100% legiblity with AE prn   Status On-going     OT LONG TERM GOAL #7   Title Pt will be able to type short email with no more than 3 errors.    Status On-going     OT LONG TERM GOAL #8   Title Pt will demonstate at least 10 pounds of grip strength to assist with functional tasks   Status On-going               Plan - 12/19/16 1702    Clinical Impression Statement Patient is already showing improved movement and control of digits in right hand.  Patient motivated for better hand use.     Rehab Potential Good   Current Impairments/barriers affecting progress: aphasis and apraxia   OT Frequency 2x / week   OT Duration 8 weeks   OT Treatment/Interventions Self-care/ADL training;Electrical Stimulation;Therapeutic exercise;Neuromuscular education;DME and/or AE instruction;Passive range of motion;Manual Therapy;Splinting;Therapeutic activities;Patient/family education   Plan hrasp, release, in hand manipulation, functional tasks - washing dishes, cleaning countertops, etc.     OT Home Exercise Plan Initiated today - coordination RUE   Consulted and Agree with Plan of Care Patient      Patient will benefit from skilled therapeutic intervention in order to improve the following deficits and impairments:  Decreased  cognition, Decreased coordination, Decreased range of motion, Decreased knowledge of use of DME, Decreased strength,  Impaired UE functional use, Other (comment)  Visit Diagnosis: Hemiplegia and hemiparesis following cerebral infarction affecting right dominant side (HCC)  Apraxia  Muscle weakness (generalized)  Other lack of coordination  Other symptoms and signs involving cognitive functions following cerebral infarction    Problem List Patient Active Problem List   Diagnosis Date Noted  . Internal carotid artery dissection (HCC) 12/10/2016  . Moya moya disease 12/10/2016  . History of tobacco use 12/10/2016  . Prediabetes 12/06/2016  . Hyperlipidemia 12/06/2016  . CVA (cerebral vascular accident) [facility]) 11/28/2016    Collier Salina, OTR/L 12/19/2016, 5:04 PM  [facility] [address] [place], [place], [address] Phone: [phone]   Fax:  [phone]  Name: Mikias Lanz MRN: [id number] Date of Birth: [birth year]

## 2016-12-22 ENCOUNTER — Ambulatory Visit: Payer: BLUE CROSS/BLUE SHIELD | Admitting: *Deleted

## 2016-12-22 DIAGNOSIS — R41841 Cognitive communication deficit: Secondary | ICD-10-CM

## 2016-12-22 DIAGNOSIS — R482 Apraxia: Secondary | ICD-10-CM | POA: Diagnosis not present

## 2016-12-22 NOTE — Therapy (Signed)
[facility] [address] [place], [place], [address] Phone: [phone]   Fax:  [phone]  Speech Language Pathology Treatment  Patient Details  Name: Manuel Boyer MRN: [id number] Date of Birth: 1991/02/17 Referring Provider: Gust Rung, DO  Encounter Date: 12/22/2016      End of Session - 12/22/16 1235    Visit Number 3   Number of Visits 24   Date for SLP Re-Evaluation 02/09/17   SLP Start Time 1145   SLP Stop Time  1230   SLP Time Calculation (min) 45 min   Activity Tolerance Patient tolerated treatment well      Past Medical History:  Diagnosis Date  . Internal carotid artery dissection (HCC)   . Moya moya disease     Past Surgical History:  Procedure Laterality Date  . IR ANGIO INTRA EXTRACRAN SEL COM CAROTID INNOMINATE BILAT MOD SED  11/28/2016  . IR ANGIO VERTEBRAL SEL VERTEBRAL BILAT MOD SED  11/28/2016  . RADIOLOGY WITH ANESTHESIA N/A 11/28/2016   Procedure: RADIOLOGY WITH ANESTHESIA;  Surgeon: Julieanne Cotton, MD;  Location: [facility] OR;  Service: Radiology;  Laterality: N/A;    There were no vitals filed for this visit.      Subjective Assessment - 12/22/16 1152    Subjective My finger is numb and tingling all through my right hand (not new)   Patient is accompained by: Family member  deb - mother in law   Currently in Pain? No/denies   Pain Location Finger (Comment which one)   Pain Orientation --  middle finger numb   Pain Descriptors / Indicators Numbness               ADULT SLP TREATMENT - 12/22/16 0001      General Information   Behavior/Cognition Alert;Cooperative;Pleasant mood     Treatment Provided   Treatment provided Cognitive-Linquistic     Pain Assessment   Pain Assessment No/denies pain  pt indicates continued right middle finger numbness     Cognitive-Linquistic Treatment   Treatment focused on Aphasia;Patient/family/caregiver education   Skilled  Treatment ST session focused on continued diagnostic treatment including administration of portions of the Reading Comprehension Battery for Adults (RCBA), as pt reports he enjoyed reading prior to the stroke, but now has difficulty. Pt acheived 70% on Paragraph/Picture subtest (VII), 100% on paragraph factual, 60% on paragraph inferential, and 90% on Morpho-Syntax subtest. Pt was able to engage in conversation with SLP about his hobbies, children, using full sentences peppered with "um".     Assessment / Recommendations / Plan   Plan Continue with current plan of care     Progression Toward Goals   Progression toward goals Progressing toward goals          SLP Education - 12/22/16 1227    Education provided Yes   Education Details worksheets given for completion at home   Person(s) Educated Patient   Methods Explanation;Demonstration;Handout;Verbal cues   Comprehension Verbalized understanding          SLP Short Term Goals - 12/22/16 1239      SLP SHORT TERM GOAL #1   Title Pt will name 8 items for simple categories with occasional min A.   Time 4   Period Weeks   Status On-going     SLP SHORT TERM GOAL #2   Title pt will demo understanding of 10 minutes of simple-mod complex conversation over two sessions   Time 4   Period Weeks  Status On-going     SLP SHORT TERM GOAL #3   Title pt will generate simple sentences responding to therapy stimuli with of average 5 words over two sessions   Time 4   Period Weeks   Status On-going     SLP SHORT TERM GOAL #4   Title Pt will participate in assessment of cognitive communication within first 4 sessions.   Time 4   Period Weeks   Status On-going          SLP Long Term Goals - 12/22/16 1239      SLP LONG TERM GOAL #1   Title Pt will utilize compensations for aphasia 80% of opportunities in simple conversation with occasional min A   Time 8   Period Weeks   Status On-going     SLP LONG TERM GOAL #2   Title pt will  participate functionally in a 10 minute simple conversation in 2 sessions   Time 8   Period Weeks   Status On-going     SLP LONG TERM GOAL #3   Title Pt will demo understanding of 15 min simple to mod complex conversation with repeats allowed x2.   Time 8   Period Weeks   Status On-going          Plan - 12/22/16 1236    Clinical Impression Statement Pt cooperative with unfamiliar therapist. Pt continues to exhibit expressive language deficits, and demonstrates difficulty with more complex reading comprehension. Continued ST intervention is recommended to facilitate effective communication, improve quality of life, and increase likelihood of return to work.    Speech Therapy Frequency 3x / week   Duration --  8 weeks/24 visits   Treatment/Interventions Cueing hierarchy;Functional tasks;Patient/family education;Multimodal communcation approach;Cognitive reorganization;Language facilitation;Internal/external aids;SLP instruction and feedback;Compensatory techniques   Potential to Achieve Goals Good   Potential Considerations Family/community support;Ability to learn/carryover information;Previous level of function;Cooperation/participation level   SLP Home Exercise Plan reviewed and provided   Consulted and Agree with Plan of Care Patient;Family member/caregiver   Family Member Consulted mother in law       Patient will benefit from skilled therapeutic intervention in order to improve the following deficits and impairments:   Cognitive communication deficit    Problem List Patient Active Problem List   Diagnosis Date Noted  . Internal carotid artery dissection (HCC) 12/10/2016  . Moya moya disease 12/10/2016  . History of tobacco use 12/10/2016  . Prediabetes 12/06/2016  . Hyperlipidemia 12/06/2016  . CVA (cerebral vascular accident) (HCC) 11/28/2016   Celia B. ChelseaBueche, MSP, CCC-SLP  Leigh AuroraBueche, Celia Brown 12/22/2016, 12:41 PM  [facility] [address] [place], [place], [address] Phone: [phone]   Fax:  [phone]   Name: Noe GensJamar Julson MRN: [id number] Date of Birth: Jan 03, 1991

## 2016-12-25 ENCOUNTER — Ambulatory Visit: Payer: BLUE CROSS/BLUE SHIELD | Admitting: Occupational Therapy

## 2016-12-25 ENCOUNTER — Encounter: Payer: BLUE CROSS/BLUE SHIELD | Admitting: Speech Pathology

## 2016-12-25 ENCOUNTER — Encounter: Payer: Self-pay | Admitting: Occupational Therapy

## 2016-12-25 DIAGNOSIS — I69318 Other symptoms and signs involving cognitive functions following cerebral infarction: Secondary | ICD-10-CM

## 2016-12-25 DIAGNOSIS — R278 Other lack of coordination: Secondary | ICD-10-CM

## 2016-12-25 DIAGNOSIS — R482 Apraxia: Secondary | ICD-10-CM

## 2016-12-25 DIAGNOSIS — I69351 Hemiplegia and hemiparesis following cerebral infarction affecting right dominant side: Secondary | ICD-10-CM

## 2016-12-25 DIAGNOSIS — M6281 Muscle weakness (generalized): Secondary | ICD-10-CM

## 2016-12-25 NOTE — Patient Instructions (Signed)
Spend up to 5 minutes each day on "ball handling skills"  1)  Pass a tennis ball back and forth from left to right hand and back. 2)  Pass the tennis ball behind your back. 3)  Toss a tennis ball between your left and right hand.  4-6) Spin the tennis ball in a clockwise circle in your right hand, then counterclockwise, then top to bottom.

## 2016-12-25 NOTE — Therapy (Signed)
[facility] [address] [place], [place], [address] Phone: [phone]   Fax:  [phone]  Occupational Therapy Treatment  Patient Details  Name: Manuel Boyer MRN: [id number] Date of Birth: 1991-05-24 Referring Provider: Dr. Carlynn Purl  Encounter Date: 12/25/2016      OT End of Session - 12/25/16 1209    Visit Number 4   Number of Visits 16   Date for OT Re-Evaluation 02/07/17   Authorization Type [employer] 35 visits for OT   Authorization - Visit Number 4   Authorization - Number of Visits 35   OT Start Time 1102   OT Stop Time 1147   OT Time Calculation (min) 45 min   Activity Tolerance Patient tolerated treatment well   Behavior During Therapy [facility] for tasks assessed/performed      Past Medical History:  Diagnosis Date  . Internal carotid artery dissection (HCC)   . Moya moya disease     Past Surgical History:  Procedure Laterality Date  . IR ANGIO INTRA EXTRACRAN SEL COM CAROTID INNOMINATE BILAT MOD SED  11/28/2016  . IR ANGIO VERTEBRAL SEL VERTEBRAL BILAT MOD SED  11/28/2016  . RADIOLOGY WITH ANESTHESIA N/A 11/28/2016   Procedure: RADIOLOGY WITH ANESTHESIA;  Surgeon: Julieanne Cotton, MD;  Location: [facility] OR;  Service: Radiology;  Laterality: N/A;    There were no vitals filed for this visit.      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 No/denies   Pain Score 0-No pain                      OT Treatments/Exercises (OP) - 12/25/16 0001      ADLs   Writing Handwriting to address grip on pen.  Patient now able to make initial 3 point contact, with thumb and 2nd/3rd digit, but has difficulty sustaining  sufficient force to write. Poorly graded control in digits, leads to increased flexion and poor prehension.  Patient also has difficulty managing hand position with needed wrist forearm movement for writing. Gave patient handwriting exercises to complete at home.with built up foam on pen.       Neurological Re-education Exercises   Other Exercises 1 Focused on grading pressure, and improving activation in thumb and fingers.  Patient able to move thumb or digits, but has difficulty moving simultaneously as will be needed for in hand manipulation.  Patient with improved ability to sustain tension appropriately in fingers for grasp/ release - used graded resistance clothespins to highlight effectiveness.  Patient needs functional context for hand function.  Worked on his ability to utilize scissors in right hand to cut straight line.  Patient needed min assist, but with repetition of task - able to improve spontaneous opening and closing of thumb and 2nd/3rd digits.                  OT Education - 12/25/16 1208    Education provided Yes   Education Details further education on coordination as related to grasp, pinch, release, and handwriting   Person(s) Educated Patient   Methods Explanation;Demonstration;Verbal cues;Tactile cues   Comprehension Need further instruction  OT Short Term Goals - 12/18/16 1347      OT SHORT TERM GOAL #1   Title Pt will be mod I with HEP- 8/82018   Status On-going     OT SHORT TERM GOAL #2   Title Pt will be mod I with bathing   Status On-going     OT SHORT TERM GOAL #3   Title Pt will be mod I with shower transfers   Status On-going     OT SHORT TERM GOAL #4   Title Pt will be mod I using electric razor   Status On-going     OT SHORT TERM GOAL #5   Title Pt will feed self at least 50% of meals with RUE with AE prn   Status On-going     OT SHORT TERM GOAL #6   Title Pt will be able to write first name legibly using AE prn   Status  On-going     OT SHORT TERM GOAL #7   Title Pt will use RUE as gross assist at least 25% of the time  for simple familiar hot meal prep and require no more than supervision    Status On-going           OT Long Term Goals - 12/18/16 1347      OT LONG TERM GOAL #1   Title Pt will be mod I with upgraded HEP- 02/07/2017   Status On-going     OT LONG TERM GOAL #2   Title Pt will eat 100% of meals with RUE with AE prn   Status On-going     OT LONG TERM GOAL #3   Title Pt will use RUE as gross assist at least 50% of the time for hot meal prep and will be mod I   Status On-going     OT LONG TERM GOAL #4   Title Pt will be able to play simple song on drums using both hands   Status On-going     OT LONG TERM GOAL #5   Title Pt and family will verbalize understanding of driving eval recommendations.    Status On-going     OT LONG TERM GOAL #6   Title Pt will demonstrate ability to write full name with 100% legiblity with AE prn   Status On-going     OT LONG TERM GOAL #7   Title Pt will be able to type short email with no more than 3 errors.    Status On-going     OT LONG TERM GOAL #8   Title Pt will demonstate at least 10 pounds of grip strength to assist with functional tasks   Status On-going               Plan - 12/25/16 1209    Clinical Impression Statement Patient reports improved sensation in right hand.  Patient motivated for improved hand function.     Rehab Potential Good   Current Impairments/barriers affecting progress: aphasis and apraxia   OT Frequency 2x / week   OT Duration 8 weeks   OT Treatment/Interventions Self-care/ADL training;Electrical Stimulation;Therapeutic exercise;Neuromuscular education;DME and/or AE instruction;Passive range of motion;Manual Therapy;Splinting;Therapeutic activities;Patient/family education   Plan grasp, release, pinch, in hand manipulation, function - in kitchen washing dishes, sweeping, etc.     OT Home Exercise Plan  addedd ball handling exercises with tennis ball   Consulted and Agree with Plan of Care Patient      Patient will benefit from skilled therapeutic  intervention in order to improve the following deficits and impairments:  Decreased cognition, Decreased coordination, Decreased range of motion, Decreased knowledge of use of DME, Decreased strength, Impaired UE functional use, Other (comment)  Visit Diagnosis: Hemiplegia and hemiparesis following cerebral infarction affecting right dominant side (HCC)  Apraxia  Muscle weakness (generalized)  Other lack of coordination  Other symptoms and signs involving cognitive functions following cerebral infarction    Problem List Patient Active Problem List   Diagnosis Date Noted  . Internal carotid artery dissection (HCC) 12/10/2016  . Moya moya disease 12/10/2016  . History of tobacco use 12/10/2016  . Prediabetes 12/06/2016  . Hyperlipidemia 12/06/2016  . CVA (cerebral vascular accident) [facility]) 11/28/2016    Collier Salina, OTR/L 12/25/2016, 12:11 PM  [facility] [address] [place], [place], [address] Phone: [phone]   Fax:  [phone]  Name: Manuel Boyer MRN: [id number] Date of Birth: 26-Feb-1991

## 2016-12-26 ENCOUNTER — Encounter: Payer: Self-pay | Admitting: Occupational Therapy

## 2016-12-26 ENCOUNTER — Ambulatory Visit: Payer: BLUE CROSS/BLUE SHIELD | Admitting: Occupational Therapy

## 2016-12-26 DIAGNOSIS — R482 Apraxia: Secondary | ICD-10-CM | POA: Diagnosis not present

## 2016-12-26 DIAGNOSIS — M6281 Muscle weakness (generalized): Secondary | ICD-10-CM

## 2016-12-26 DIAGNOSIS — R278 Other lack of coordination: Secondary | ICD-10-CM

## 2016-12-26 DIAGNOSIS — I69318 Other symptoms and signs involving cognitive functions following cerebral infarction: Secondary | ICD-10-CM

## 2016-12-26 DIAGNOSIS — I69351 Hemiplegia and hemiparesis following cerebral infarction affecting right dominant side: Secondary | ICD-10-CM

## 2016-12-26 NOTE — Addendum Note (Signed)
Addended by: Earl LagosBLUM, Rosser [place] S on: 12/26/2016 10:59 AM   Modules accepted: Orders

## 2016-12-26 NOTE — Therapy (Signed)
[facility] [address] [place], [place], [address] Phone: [phone]   Fax:  [phone]  Occupational Therapy Treatment  Patient Details  Name: Manuel Boyer MRN: [id number] Date of Birth: [birth year] Referring Provider: Dr. Carlynn Purl  Encounter Date: 12/26/2016      OT End of Session - 12/26/16 1509    Visit Number 5   Number of Visits 16   Date for OT Re-Evaluation 02/07/17   Authorization Type [employer] 35 visits for OT   Authorization - Visit Number 5   Authorization - Number of Visits 35   OT Start Time 1407   OT Stop Time 1451   OT Time Calculation (min) 44 min   Activity Tolerance Patient tolerated treatment well   Behavior During Therapy [facility] for tasks assessed/performed      Past Medical History:  Diagnosis Date  . Internal carotid artery dissection (HCC)   . Moya moya disease     Past Surgical History:  Procedure Laterality Date  . IR ANGIO INTRA EXTRACRAN SEL COM CAROTID INNOMINATE BILAT MOD SED  11/28/2016  . IR ANGIO VERTEBRAL SEL VERTEBRAL BILAT MOD SED  11/28/2016  . RADIOLOGY WITH ANESTHESIA N/A 11/28/2016   Procedure: RADIOLOGY WITH ANESTHESIA;  Surgeon: Julieanne Cotton, MD;  Location: [facility] OR;  Service: Radiology;  Laterality: N/A;    There were no vitals filed for this visit.      Subjective Assessment - 12/26/16 1412    Subjective  (P)  I was able to pass the tennis balls back and forth around the back.     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 (P)  No/denies   Pain Score (P)  0-No pain                      OT Treatments/Exercises (OP) - 12/26/16 0001      Neurological Re-education Exercises   Other Exercises 1 Patient lacks full digit extension, and ability to isolate digits, therefore  prehension patterns are still challenging.  Patient is showing improved grip strength, and ability to sustain grip for 1-2 seconds.     Other Exercises 2 Worked on functional use of right hand for turning pages fromleft to right or right to left.  Wrapped fingertips in coban for improved friction.  Worked to grasp/ pinch release large bolts and nuts.  Patient with best performance with largest sized  bolt / nut.  If bolt stable able to turn nut with right hand - using mostly digits versus wrist motion.  Patient eager to try this at home.  Attempted to buddy strap second and third finger to promote improved extension - not very effective for this purpose in this session.     Hand Gripper with Large Beads Able to move 10 blocks on lowest gripper setting today without physical assistance.  Had to break this into two sets of 5 due to fatigue.                  OT Education - 12/26/16 1508    Education provided Yes   Education Details patient will continue to work on functional use of right hand - turning pages, picking up nuts and bolts, counting off on fingers 1-5.     Person(s)  Educated Secretary/administratoratient;Caregiver(s)  Mother in law   Methods Explanation;Demonstration;Tactile cues;Verbal cues   Comprehension Verbalized understanding;Returned demonstration          OT Short Term Goals - 12/18/16 1347      OT SHORT TERM GOAL #1   Title Pt will be mod I with HEP- 8/82018   Status On-going     OT SHORT TERM GOAL #2   Title Pt will be mod I with bathing   Status On-going     OT SHORT TERM GOAL #3   Title Pt will be mod I with shower transfers   Status On-going     OT SHORT TERM GOAL #4   Title Pt will be mod I using electric razor   Status On-going     OT SHORT TERM GOAL #5   Title Pt will feed self at least 50% of meals with RUE with AE prn   Status On-going     OT SHORT TERM GOAL #6   Title Pt will be able to write first name legibly using AE prn   Status On-going     OT SHORT TERM  GOAL #7   Title Pt will use RUE as gross assist at least 25% of the time  for simple familiar hot meal prep and require no more than supervision    Status On-going           OT Long Term Goals - 12/18/16 1347      OT LONG TERM GOAL #1   Title Pt will be mod I with upgraded HEP- 02/07/2017   Status On-going     OT LONG TERM GOAL #2   Title Pt will eat 100% of meals with RUE with AE prn   Status On-going     OT LONG TERM GOAL #3   Title Pt will use RUE as gross assist at least 50% of the time for hot meal prep and will be mod I   Status On-going     OT LONG TERM GOAL #4   Title Pt will be able to play simple song on drums using both hands   Status On-going     OT LONG TERM GOAL #5   Title Pt and family will verbalize understanding of driving eval recommendations.    Status On-going     OT LONG TERM GOAL #6   Title Pt will demonstrate ability to write full name with 100% legiblity with AE prn   Status On-going     OT LONG TERM GOAL #7   Title Pt will be able to type short email with no more than 3 errors.    Status On-going     OT LONG TERM GOAL #8   Title Pt will demonstate at least 10 pounds of grip strength to assist with functional tasks   Status On-going               Plan - 12/26/16 1510    Clinical Impression Statement Patient is showing steady improvement in movement and sensation in right hand.  Working tohelp this carryover into improved functional use of right hand.    Rehab Potential Good   Current Impairments/barriers affecting progress: aphasis and apraxia   OT Frequency 2x / week   OT Duration 8 weeks   OT Treatment/Interventions Self-care/ADL training;Electrical Stimulation;Therapeutic exercise;Neuromuscular education;DME and/or AE instruction;Passive range of motion;Manual Therapy;Splinting;Therapeutic activities;Patient/family education   Plan in kitchen-  washing dishes, sweeping, etc, consider estim for digit extension, grasp, release, hand  strengthening and isolated control   OT Home Exercise Plan addedd ball handling exercises with tennis ball   Consulted and Agree with Plan of Care Patient      Patient will benefit from skilled therapeutic intervention in order to improve the following deficits and impairments:  Decreased cognition, Decreased coordination, Decreased range of motion, Decreased knowledge of use of DME, Decreased strength, Impaired UE functional use, Other (comment)  Visit Diagnosis: Hemiplegia and hemiparesis following cerebral infarction affecting right dominant side (HCC)  Apraxia  Muscle weakness (generalized)  Other lack of coordination  Other symptoms and signs involving cognitive functions following cerebral infarction    Problem List Patient Active Problem List   Diagnosis Date Noted  . Internal carotid artery dissection (HCC) 12/10/2016  . Moya moya disease 12/10/2016  . History of tobacco use 12/10/2016  . Prediabetes 12/06/2016  . Hyperlipidemia 12/06/2016  . CVA (cerebral vascular accident) (HCC) 11/28/2016    Collier Salina 12/26/2016, 3:12 PM   [facility] [address] [place], [place], [address] Phone: [phone]   Fax:  [phone]  Name: Samanyu Tinnell MRN: [id number] Date of Birth: Dec 31, 1990

## 2016-12-27 ENCOUNTER — Other Ambulatory Visit: Payer: Self-pay | Admitting: Internal Medicine

## 2016-12-27 ENCOUNTER — Other Ambulatory Visit: Payer: Self-pay

## 2016-12-27 ENCOUNTER — Ambulatory Visit: Payer: BLUE CROSS/BLUE SHIELD

## 2016-12-27 DIAGNOSIS — R41841 Cognitive communication deficit: Secondary | ICD-10-CM

## 2016-12-27 DIAGNOSIS — R4701 Aphasia: Secondary | ICD-10-CM

## 2016-12-27 DIAGNOSIS — R482 Apraxia: Secondary | ICD-10-CM | POA: Diagnosis not present

## 2016-12-27 NOTE — Patient Instructions (Signed)
Think about pre-planning situations that you might be frustrated by and see if you can modify/change them so you don't get frustrated.

## 2016-12-27 NOTE — Telephone Encounter (Signed)
aspirin EC 81 MG EC tablet,  clopidogrel (PLAVIX) 75 MG tablet, refill request @ [facility] [address]

## 2016-12-27 NOTE — Therapy (Signed)
[facility] [address] [place], [place], [address] Phone: [phone]   Fax:  [phone]  Speech Language Pathology Treatment  Patient Details  Name: Manuel Boyer MRN: [id number] Date of Birth: February 07, 1991 Referring Provider: Gust RungHoffman, Erik C, DO  Encounter Date: 12/27/2016      End of Session - 12/27/16 1723    Visit Number 4   Number of Visits 24   Date for SLP Re-Evaluation 02/09/17   SLP Start Time 0933   SLP Stop Time  1015   SLP Time Calculation (min) 42 min   Activity Tolerance Patient tolerated treatment well      Past Medical History:  Diagnosis Date  . Internal carotid artery dissection (HCC)   . Moya moya disease     Past Surgical History:  Procedure Laterality Date  . IR ANGIO INTRA EXTRACRAN SEL COM CAROTID INNOMINATE BILAT MOD SED  11/28/2016  . IR ANGIO VERTEBRAL SEL VERTEBRAL BILAT MOD SED  11/28/2016  . RADIOLOGY WITH ANESTHESIA N/A 11/28/2016   Procedure: RADIOLOGY WITH ANESTHESIA;  Surgeon: Julieanne Cottoneveshwar, Sanjeev, MD;  Location: [facility] OR;  Service: Radiology;  Laterality: N/A;    There were no vitals filed for this visit.      Subjective Assessment - 12/27/16 0941    Subjective "We work on my reeking (reading)." Pt, re: last session.   Patient is accompained by: --  Deb, mother in law   Currently in Pain? Yes   Pain Location Hand   Pain Orientation Right   Pain Descriptors / Indicators Tingling   Pain Type Acute pain   Pain Onset More than a month ago  "since - the 28th - of - June"   Pain Frequency Intermittent               ADULT SLP TREATMENT - 12/27/16 0944      General Information   Behavior/Cognition Alert;Cooperative;Pleasant mood     Treatment Provided   Treatment provided Cognitive-Linquistic     Cognitive-Linquistic Treatment   Treatment focused on Aphasia;Patient/family/caregiver education   Skilled Treatment Educated pt/family on prognosis of  receptive/expressive language disorder, and that progress might appear variable but general trend should be upwards. Pt and Deb endorsed that this is the case. Further, SLP problem solved with pt and mother in law about situations that might get pt frustrated and lead to pt "shuting down" his talking. SLP facilitated work with pt's expressive language by reviewing his homework with him - pt with functional speech in shorter (5 minutes) mod complex conversation if using slower rate. With faster rate, pt still functional but with longer time needed for word choice/sentence construction.         Assessment / Recommendations / Plan   Plan Continue with current plan of care     Progression Toward Goals   Progression toward goals Progressing toward goals          SLP Education - 12/27/16 1722    Education provided Yes   Education Details compensations for frustration with speech in "rushed" situations   Person(s) Educated Patient;Caregiver(s)   Methods Explanation   Comprehension Verbalized understanding          SLP Short Term Goals - 12/27/16 1724      SLP SHORT TERM GOAL #1   Title Pt will name 8 items for simple categories with occasional min A.   Time 3   Period Weeks   Status On-going     SLP SHORT TERM GOAL #  2   Title pt will demo understanding of 10 minutes of simple-mod complex conversation over two sessions   Time 3   Period Weeks   Status On-going     SLP SHORT TERM GOAL #3   Title pt will generate simple sentences responding to therapy stimuli with of average 5 words over two sessions   Baseline 12-27-16   Time 3   Period Weeks   Status On-going     SLP SHORT TERM GOAL #4   Title Pt will participate in assessment of cognitive communication within first 4 sessions.   Time 3   Period Weeks   Status On-going          SLP Long Term Goals - 12/27/16 1725      SLP LONG TERM GOAL #1   Title Pt will utilize compensations for aphasia 80% of opportunities in simple  conversation with occasional min A   Time 7   Period Weeks   Status On-going     SLP LONG TERM GOAL #2   Title pt will participate functionally in a 10 minute simple conversation in 2 sessions   Time 7   Period Weeks   Status On-going     SLP LONG TERM GOAL #3   Title Pt will demo understanding of 15 min simple to mod complex conversation with repeats allowed x2.   Time 7   Period Weeks   Status On-going          Plan - 12/27/16 1723    Clinical Impression Statement Pt cooperative with unfamiliar therapist. Pt continues to exhibit mild-mod expressive language deficits, although simple conversation is functional with extra time today. Pt reprots "shutting down" when he is frustrated and this negatively affects his speech. Continued ST intervention is recommended to facilitate effective communication, improve quality of life, and increase likelihood of return to work.    Speech Therapy Frequency 3x / week   Duration --  8 weeks/24 visits   Treatment/Interventions Cueing hierarchy;Functional tasks;Patient/family education;Multimodal communcation approach;Cognitive reorganization;Language facilitation;Internal/external aids;SLP instruction and feedback;Compensatory techniques   Potential to Achieve Goals Good   Potential Considerations Family/community support;Ability to learn/carryover information;Previous level of function;Cooperation/participation level   SLP Home Exercise Plan reviewed and provided   Consulted and Agree with Plan of Care Patient;Family member/caregiver   Family Member Consulted mother in law       Patient will benefit from skilled therapeutic intervention in order to improve the following deficits and impairments:   Aphasia  Apraxia  Cognitive communication deficit    Problem List Patient Active Problem List   Diagnosis Date Noted  . Internal carotid artery dissection (HCC) 12/10/2016  . Moya moya disease 12/10/2016  . History of tobacco use  12/10/2016  . Prediabetes 12/06/2016  . Hyperlipidemia 12/06/2016  . CVA (cerebral vascular accident) (HCC) 11/28/2016    [facility] Hospital[facility]INKE,Lurdes Haltiwanger ,MS, CCC-SLP  12/27/2016, 5:25 PM  [facility] [address] [place], [place], [address] Phone: [phone]   Fax:  [phone]   Name: Manuel Boyer MRN: [id number] Date of Birth: [birth year]

## 2016-12-28 ENCOUNTER — Other Ambulatory Visit: Payer: Self-pay | Admitting: Internal Medicine

## 2016-12-28 MED ORDER — CLOPIDOGREL BISULFATE 75 MG PO TABS: 75.0000 mg | ORAL_TABLET | Freq: Every day | ORAL | 0 refills | Status: DC

## 2016-12-28 MED ORDER — ASPIRIN 81 MG PO TBEC: 81.0000 mg | DELAYED_RELEASE_TABLET | Freq: Every day | ORAL | 0 refills | Status: DC

## 2016-12-29 ENCOUNTER — Ambulatory Visit: Payer: BLUE CROSS/BLUE SHIELD | Admitting: *Deleted

## 2016-12-29 DIAGNOSIS — R482 Apraxia: Secondary | ICD-10-CM | POA: Diagnosis not present

## 2016-12-29 DIAGNOSIS — R41841 Cognitive communication deficit: Secondary | ICD-10-CM

## 2016-12-29 NOTE — Therapy (Signed)
[facility] [address] [place], [place], [address] Phone: [phone]   Fax:  [phone]  Speech Language Pathology Treatment  Patient Details  Name: Manuel Boyer MRN: [id number] Date of Birth: 1991/04/09 Referring Provider: Gust RungHoffman, Erik C, DO  Encounter Date: 12/29/2016      End of Session - 12/29/16 1052    Visit Number 5   Number of Visits 24   Date for SLP Re-Evaluation 02/09/17   SLP Start Time 1015   SLP Stop Time  1100   SLP Time Calculation (min) 45 min   Activity Tolerance Patient tolerated treatment well      Past Medical History:  Diagnosis Date  . Internal carotid artery dissection (HCC)   . Moya moya disease     Past Surgical History:  Procedure Laterality Date  . IR ANGIO INTRA EXTRACRAN SEL COM CAROTID INNOMINATE BILAT MOD SED  11/28/2016  . IR ANGIO VERTEBRAL SEL VERTEBRAL BILAT MOD SED  11/28/2016  . RADIOLOGY WITH ANESTHESIA N/A 11/28/2016   Procedure: RADIOLOGY WITH ANESTHESIA;  Surgeon: Julieanne Cottoneveshwar, Sanjeev, MD;  Location: [facility] OR;  Service: Radiology;  Laterality: N/A;    There were no vitals filed for this visit.      Subjective Assessment - 12/29/16 1015    Subjective I forgot what today felt like   Patient is accompained by: Family member  Deb   Currently in Pain? Yes   Pain Score 6    Pain Location Hand   Pain Orientation Right   Pain Descriptors / Indicators Tingling   Pain Type Acute pain               ADULT SLP TREATMENT - 12/29/16 0001      General Information   Behavior/Cognition Alert;Cooperative;Pleasant mood     Treatment Provided   Treatment provided Cognitive-Linquistic     Pain Assessment   Pain Assessment 0-10   Pain Score 6    Pain Location right hand   Pain Descriptors / Indicators Tingling     Cognitive-Linquistic Treatment   Treatment focused on Aphasia;Patient/family/caregiver education   Skilled Treatment ST session focused on review  of homework - pt did well with writing answers for word deduction. Pt stated wife provided min cues. Pt continues to work on worksheet for providing 3 different meanings. Pt reports his main goal is speaking with better fluency. SLP and pt discussed strategies for working through frustration (minimize distractions, calm down, take a deep breath, write down key words). The montreal Cognitive Assessment (MoCA) was administered. Pt scored 27/30 (n=26+/30), indicating performance within normal limits for his age and education level (college graduate). Points lost on thought organization (animal naming task) and delayed recall (recalled 3/5 unrelated words after delay).     Assessment / Recommendations / Plan   Plan Continue with current plan of care     Progression Toward Goals   Progression toward goals Progressing toward goals          SLP Education - 12/29/16 1051    Education provided Yes   Education Details continued education regarding frustration, continue worksheet on multiple meanings   Person(s) Educated Patient  mother in law, Deb   Methods Handout;Verbal cues;Explanation   Comprehension Verbalized understanding          SLP Short Term Goals - 12/29/16 1104      SLP SHORT TERM GOAL #1   Title Pt will name 8 items for simple categories with occasional min A.  Status Achieved     SLP SHORT TERM GOAL #2   Title pt will demo understanding of 10 minutes of simple-mod complex conversation over two sessions   Baseline 12/29/16   Time 3   Period Weeks   Status On-going     SLP SHORT TERM GOAL #3   Title pt will generate simple sentences responding to therapy stimuli with of average 5 words over two sessions   Baseline 12-27-16   Time 3   Period Weeks   Status On-going     SLP SHORT TERM GOAL #4   Title Pt will participate in assessment of cognitive communication within first 4 sessions.   Baseline 27/30 on MoCA   Status Achieved          SLP Long Term Goals -  12/29/16 1105      SLP LONG TERM GOAL #1   Title Pt will utilize compensations for aphasia 80% of opportunities in simple conversation with occasional min A   Time 7   Period Weeks   Status On-going     SLP LONG TERM GOAL #2   Title pt will participate functionally in a 10 minute simple conversation in 2 sessions   Time 7   Period Weeks   Status On-going     SLP LONG TERM GOAL #3   Title Pt will demo understanding of 15 min simple to mod complex conversation with repeats allowed x2.   Time 7   Period Weeks   Status On-going          Plan - 12/29/16 1101    Clinical Impression Statement Pt continues to be motivated in and outside of therapy. He is receptive to suggestions and strategies in session, and follows through at home. Continue ST intervention is recommended to facilitate effective communication, improve quality of life and increase likelihood of return to work.   Speech Therapy Frequency 3x / week   Duration --  8 weeks/24 visits   Treatment/Interventions Cueing hierarchy;Functional tasks;Patient/family education;Multimodal communcation approach;Cognitive reorganization;Language facilitation;Internal/external aids;SLP instruction and feedback;Compensatory techniques   Potential to Achieve Goals Good   Potential Considerations Family/community support;Ability to learn/carryover information;Previous level of function;Cooperation/participation level   SLP Home Exercise Plan reviewed and provided   Consulted and Agree with Plan of Care Patient;Family member/caregiver   Family Member Consulted mother in law       Patient will benefit from skilled therapeutic intervention in order to improve the following deficits and impairments:   Cognitive communication deficit    Problem List Patient Active Problem List   Diagnosis Date Noted  . Internal carotid artery dissection (HCC) 12/10/2016  . Moya moya disease 12/10/2016  . History of tobacco use 12/10/2016  . Prediabetes  12/06/2016  . Hyperlipidemia 12/06/2016  . CVA (cerebral vascular accident) (HCC) 11/28/2016   Celia B. Williams CreekB[place], MSP, CCC-SLP  Leigh AuroraBueche, Celia Brown 12/29/2016, 11:06 AM  [facility] [address] [place], [place], [address] Phone: [phone]   Fax:  [phone]   Name: Manuel Boyer MRN: [id number] Date of Birth: [birth year]

## 2017-01-01 ENCOUNTER — Encounter: Payer: Self-pay | Admitting: Occupational Therapy

## 2017-01-01 ENCOUNTER — Ambulatory Visit: Payer: BLUE CROSS/BLUE SHIELD | Admitting: Occupational Therapy

## 2017-01-01 ENCOUNTER — Ambulatory Visit: Payer: BLUE CROSS/BLUE SHIELD | Admitting: Speech Pathology

## 2017-01-01 DIAGNOSIS — R482 Apraxia: Secondary | ICD-10-CM | POA: Diagnosis not present

## 2017-01-01 DIAGNOSIS — I69351 Hemiplegia and hemiparesis following cerebral infarction affecting right dominant side: Secondary | ICD-10-CM

## 2017-01-01 DIAGNOSIS — M6281 Muscle weakness (generalized): Secondary | ICD-10-CM

## 2017-01-01 DIAGNOSIS — I69318 Other symptoms and signs involving cognitive functions following cerebral infarction: Secondary | ICD-10-CM

## 2017-01-01 DIAGNOSIS — R4701 Aphasia: Secondary | ICD-10-CM

## 2017-01-01 DIAGNOSIS — R278 Other lack of coordination: Secondary | ICD-10-CM

## 2017-01-01 NOTE — Therapy (Signed)
[facility] [address] [place], [place], [address] Phone: [phone]   Fax:  [phone]  Occupational Therapy Treatment  Patient Details  Name: Manuel Boyer MRN: [id number] Date of Birth: 1990-12-20 Referring Provider: Dr. Carlynn PurlErik Hoffman  Encounter Date: 01/01/2017      OT End of Session - 01/01/17 1631    Visit Number 6   Number of Visits 16   Date for OT Re-Evaluation 02/07/17   Authorization Type [employer] 35 visits for OT   Authorization - Visit Number 6   Authorization - Number of Visits 35   OT Start Time 1532   OT Stop Time 1615   OT Time Calculation (min) 43 min   Activity Tolerance Patient tolerated treatment well      Past Medical History:  Diagnosis Date  . Internal carotid artery dissection (HCC)   . Moya moya disease     Past Surgical History:  Procedure Laterality Date  . IR ANGIO INTRA EXTRACRAN SEL COM CAROTID INNOMINATE BILAT MOD SED  11/28/2016  . IR ANGIO VERTEBRAL SEL VERTEBRAL BILAT MOD SED  11/28/2016  . RADIOLOGY WITH ANESTHESIA N/A 11/28/2016   Procedure: RADIOLOGY WITH ANESTHESIA;  Surgeon: Julieanne Cottoneveshwar, Sanjeev, MD;  Location: [facility] OR;  Service: Radiology;  Laterality: N/A;    There were no vitals filed for this visit.      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 Yes   Pain Score 4    Pain Location Hand   Pain Orientation Right   Pain Descriptors / Indicators Tingling;Burning   Pain Type Acute pain   Pain Onset More than a month ago   Pain Frequency Intermittent   Aggravating Factors  its worse when I sleep    Pain Relieving Factors notihng that I can think of                      OT  Treatments/Exercises (OP) - 01/01/17 0001      Neurological Re-education Exercises   Other Exercises 1 Neuro re ed to address hand orientation, 2 and 3 finger pinch and manipulate objects with just R hand. Reinforced by using forced paradgaigm as pt tends to "palm" objects however with ring finger and pinky finger restrained pt with improved ability to use pinch for functional activities.  Also addressed picking up and orienting pen in right hand without using L hand to put pen into right hand.  Addressed bilateral functional task to force increased use of R hand.  Pt needs max cues to fully open his hand in order to pick up objects as well as cues to move fingers vs just posturing hand and using as a "claw."  Pt has full finger extension this week however opposition for thumb is very weak.  Pt remains with signficant apraxia and still uses LUE for majority of tasks. Pt's perception is that he is using his right hand for as much as he can howvever in observation pt rarely intiates use of R hand and only uses R hand if activity requires it.  OT Short Term Goals - 01/01/17 1629      OT SHORT TERM GOAL #1   Title Pt will be mod I with HEP- 01/10/2017   Status On-going     OT SHORT TERM GOAL #2   Title Pt will be mod I with bathing   Status Achieved     OT SHORT TERM GOAL #3   Title Pt will be mod I with shower transfers   Status Achieved     OT SHORT TERM GOAL #4   Title Pt will be mod I using electric razor   Status Achieved     OT SHORT TERM GOAL #5   Title Pt will feed self at least 50% of meals with RUE with AE prn   Status Achieved     OT SHORT TERM GOAL #6   Title Pt will be able to write first name legibly using AE prn   Status Achieved     OT SHORT TERM GOAL #7   Title Pt will use RUE as gross assist at least 25% of the time  for simple familiar hot meal prep and require no more than supervision    Status On-going           OT Long Term Goals -  01/01/17 1629      OT LONG TERM GOAL #1   Title Pt will be mod I with upgraded HEP- 02/07/2017   Status On-going     OT LONG TERM GOAL #2   Title Pt will eat 100% of meals with RUE with AE prn   Status On-going     OT LONG TERM GOAL #3   Title Pt will use RUE as gross assist at least 50% of the time for hot meal prep and will be mod I   Status On-going     OT LONG TERM GOAL #4   Title Pt will be able to play simple song on drums using both hands   Status On-going     OT LONG TERM GOAL #5   Title Pt and family will verbalize understanding of driving eval recommendations.    Status On-going     OT LONG TERM GOAL #6   Title Pt will demonstrate ability to write full name with 100% legiblity with AE prn   Status On-going     OT LONG TERM GOAL #7   Title Pt will be able to type short email with no more than 3 errors.    Status On-going     OT LONG TERM GOAL #8   Title Pt will demonstate at least 10 pounds of grip strength to assist with functional tasks   Status On-going               Plan - 01/01/17 1629    Clinical Impression Statement Pt progressing toward goals. Pt with improved digit extension in R hand and improved hand writing.    Rehab Potential Good   Current Impairments/barriers affecting progress: aphasis and apraxia   OT Frequency 2x / week   OT Duration 8 weeks   OT Treatment/Interventions Self-care/ADL training;Electrical Stimulation;Therapeutic exercise;Neuromuscular education;DME and/or AE instruction;Passive range of motion;Manual Therapy;Splinting;Therapeutic activities;Patient/family education   Plan ktichen activity for simple cooking, washing dishes to encourage spontneous use of RUE, 2 and 3 point pinch, manipulation in hand, hand strengthening and isolated control    Consulted and Agree with Plan of Care Patient      Patient will benefit from skilled therapeutic intervention in  order to improve the following deficits and impairments:  Decreased  cognition, Decreased coordination, Decreased range of motion, Decreased knowledge of use of DME, Decreased strength, Impaired UE functional use, Other (comment)  Visit Diagnosis: Hemiplegia and hemiparesis following cerebral infarction affecting right dominant side (HCC)  Muscle weakness (generalized)  Other lack of coordination  Other symptoms and signs involving cognitive functions following cerebral infarction  Apraxia    Problem List Patient Active Problem List   Diagnosis Date Noted  . Internal carotid artery dissection (HCC) 12/10/2016  . Moya moya disease 12/10/2016  . History of tobacco use 12/10/2016  . Prediabetes 12/06/2016  . Hyperlipidemia 12/06/2016  . CVA (cerebral vascular accident) (HCC) 11/28/2016    Norton PastelPulaski, Lucylle Foulkes Halliday, OTR/L 01/01/2017, 4:32 PM  [facility] [address] [place], [place], [address] Phone: [phone]   Fax:  [phone]  Name: Manuel Boyer MRN: [id number] Date of Birth: [birth year]

## 2017-01-01 NOTE — Therapy (Signed)
[facility] [address] [place], [place], [address] Phone: [phone]   Fax:  [phone]  Speech Language Pathology Treatment  Patient Details  Name: Manuel Boyer MRN: [id number] Date of Birth: [birth year] Referring Provider: Gust RungHoffman, Erik C, DO  Encounter Date: 01/01/2017      End of Session - 01/01/17 1735    Visit Number 6   Number of Visits 24   Date for SLP Re-Evaluation 02/09/17   SLP Start Time 1627   SLP Stop Time  1703   SLP Time Calculation (min) 36 min   Activity Tolerance Patient tolerated treatment well      Past Medical History:  Diagnosis Date  . Internal carotid artery dissection (HCC)   . Moya moya disease     Past Surgical History:  Procedure Laterality Date  . IR ANGIO INTRA EXTRACRAN SEL COM CAROTID INNOMINATE BILAT MOD SED  11/28/2016  . IR ANGIO VERTEBRAL SEL VERTEBRAL BILAT MOD SED  11/28/2016  . RADIOLOGY WITH ANESTHESIA N/A 11/28/2016   Procedure: RADIOLOGY WITH ANESTHESIA;  Surgeon: Julieanne Cottoneveshwar, Sanjeev, MD;  Location: [facility] OR;  Service: Radiology;  Laterality: N/A;    There were no vitals filed for this visit.      Subjective Assessment - 01/01/17 1631    Subjective "just processing it in my mind and getting it set up to say."   Currently in Pain? Yes   Pain Score 4    Pain Location Hand   Pain Orientation Right   Pain Descriptors / Indicators Tingling;Burning   Pain Type Acute pain   Pain Onset More than a month ago   Pain Frequency Intermittent               ADULT SLP TREATMENT - 01/01/17 1632      General Information   Behavior/Cognition Alert;Cooperative;Pleasant mood   Patient Positioning Upright in chair   Oral care provided N/A     Treatment Provided   Treatment provided Cognitive-Linquistic     Cognitive-Linquistic Treatment   Treatment focused on Aphasia;Patient/family/caregiver education   Skilled Treatment Patient present for treatment with his  daughter today. ST reviewed pt's homework for writing 3-4 descriptions of common items, which completed 95%. SLP facilitated description of remaining items with min-mod A questioning cues. Provided education to pt, daughter re: supported conversation for aphasia as well as providing cues to facilitate word-finding. In 10 mins simple to mod complex conversation pt demo'd understanding, required occasional mod A and extended time for wordfinding.     Assessment / Recommendations / Plan   Plan Continue with current plan of care     Progression Toward Goals   Progression toward goals Progressing toward goals          SLP Education - 01/01/17 1735    Education provided Yes   Education Details supported conversation for aphasia   Person(s) Educated Patient;Child(ren)   Methods Explanation;Demonstration   Comprehension Verbalized understanding;Need further instruction          SLP Short Term Goals - 01/01/17 1727      SLP SHORT TERM GOAL #1   Title Pt will name 8 items for simple categories with occasional min A.   Period Weeks   Status Achieved     SLP SHORT TERM GOAL #2   Title pt will demo understanding of 10 minutes of simple-mod complex conversation over two sessions   Baseline 12/29/16, 01/01/17   Time 2   Period Weeks   Status Achieved  SLP SHORT TERM GOAL #3   Title pt will generate simple sentences responding to therapy stimuli with of average 5 words over two sessions   Baseline 12-27-16   Time 2   Period Weeks   Status On-going     SLP SHORT TERM GOAL #4   Title Pt will participate in assessment of cognitive communication within first 4 sessions.   Baseline 27/30 on MoCA   Status Achieved          SLP Long Term Goals - 01/01/17 1656      SLP LONG TERM GOAL #1   Title Pt will utilize compensations for aphasia 80% of opportunities in simple conversation with occasional min A   Time 6   Period Weeks   Status On-going     SLP LONG TERM GOAL #2   Title pt  will participate functionally in a 10 minute simple conversation in 2 sessions   Time 6   Period Weeks   Status On-going     SLP LONG TERM GOAL #3   Title Pt will demo understanding of 15 min simple to mod complex conversation with repeats allowed x2.   Time 6   Period Weeks   Status On-going          Plan - 01/01/17 1735    Clinical Impression Statement Pt continues to be motivated in and outside of therapy. He is receptive to suggestions and strategies in session, and follows through at home. Continue ST intervention is recommended to facilitate effective communication, improve quality of life and increase likelihood of return to work.   Speech Therapy Frequency 3x / week   Treatment/Interventions Cueing hierarchy;Functional tasks;Patient/family education;Multimodal communcation approach;Cognitive reorganization;Language facilitation;Internal/external aids;SLP instruction and feedback;Compensatory techniques   Potential to Achieve Goals Good   Potential Considerations Family/community support;Ability to learn/carryover information;Previous level of function;Cooperation/participation level   SLP Home Exercise Plan reviewed and provided   Consulted and Agree with Plan of Care Patient;Family member/caregiver   Family Member Consulted daughter      Patient will benefit from skilled therapeutic intervention in order to improve the following deficits and impairments:   Aphasia    Problem List Patient Active Problem List   Diagnosis Date Noted  . Internal carotid artery dissection (HCC) 12/10/2016  . Moya moya disease 12/10/2016  . History of tobacco use 12/10/2016  . Prediabetes 12/06/2016  . Hyperlipidemia 12/06/2016  . CVA (cerebral vascular accident) (HCC) 11/28/2016   Rondel BatonMary Beth Talyia Allende, MS, CCC-SLP Speech-Language Pathologist  Arlana LindauMary E Yesenia Locurto 01/01/2017, 5:38 PM  [facility] [address] [place], [place],  [address] Phone: [phone]   Fax:  [phone]   Name: Manuel Boyer MRN: [id number] Date of Birth: 1991/02/21

## 2017-01-03 ENCOUNTER — Ambulatory Visit: Payer: BLUE CROSS/BLUE SHIELD | Attending: Internal Medicine | Admitting: Occupational Therapy

## 2017-01-03 ENCOUNTER — Encounter: Payer: Self-pay | Admitting: Occupational Therapy

## 2017-01-03 ENCOUNTER — Ambulatory Visit: Payer: BLUE CROSS/BLUE SHIELD

## 2017-01-03 DIAGNOSIS — M6281 Muscle weakness (generalized): Secondary | ICD-10-CM | POA: Insufficient documentation

## 2017-01-03 DIAGNOSIS — I69351 Hemiplegia and hemiparesis following cerebral infarction affecting right dominant side: Secondary | ICD-10-CM | POA: Diagnosis present

## 2017-01-03 DIAGNOSIS — R41841 Cognitive communication deficit: Secondary | ICD-10-CM | POA: Insufficient documentation

## 2017-01-03 DIAGNOSIS — R482 Apraxia: Secondary | ICD-10-CM

## 2017-01-03 DIAGNOSIS — I69318 Other symptoms and signs involving cognitive functions following cerebral infarction: Secondary | ICD-10-CM | POA: Insufficient documentation

## 2017-01-03 DIAGNOSIS — R4701 Aphasia: Secondary | ICD-10-CM | POA: Diagnosis present

## 2017-01-03 DIAGNOSIS — R278 Other lack of coordination: Secondary | ICD-10-CM | POA: Insufficient documentation

## 2017-01-03 NOTE — Patient Instructions (Signed)
1. Grip Strengthening (Resistive Putty) Yellow Putty.  Make sure you make if fat between each squeeze. Squeeze long and hard, not small little squeezes.  Open your hand all the way when you pick it up. PICK IT UP LIKE YOU MEAN IT!!     Squeeze putty using thumb and all fingers. Repeat _20___ times. Do __2__ sessions per day.   2. Roll putty into tube on table and pinch between thumb, index and middle fingers.  Then roll it out again and repeat. Do this 5 times.    Copyright  VHI. All rights reserved.

## 2017-01-03 NOTE — Therapy (Signed)
[facility] [address] [place], [place], [address] Phone: [phone]   Fax:  [phone]  Occupational Therapy Treatment  Patient Details  Name: Manuel Boyer MRN: [id number] Date of Birth: March 21, 1991 Referring Provider: Dr. Carlynn PurlErik Hoffman  Encounter Date: 01/03/2017      OT End of Session - 01/03/17 1710    Visit Number 7   Number of Visits 16   Date for OT Re-Evaluation 02/07/17   Authorization Type [employer] 35 visits for OT   Authorization - Visit Number 7   Authorization - Number of Visits 35   OT Start Time 1401   OT Stop Time 1445   OT Time Calculation (min) 44 min   Activity Tolerance Patient tolerated treatment well      Past Medical History:  Diagnosis Date  . Internal carotid artery dissection (HCC)   . Moya moya disease     Past Surgical History:  Procedure Laterality Date  . IR ANGIO INTRA EXTRACRAN SEL COM CAROTID INNOMINATE BILAT MOD SED  11/28/2016  . IR ANGIO VERTEBRAL SEL VERTEBRAL BILAT MOD SED  11/28/2016  . RADIOLOGY WITH ANESTHESIA N/A 11/28/2016   Procedure: RADIOLOGY WITH ANESTHESIA;  Surgeon: Julieanne Cottoneveshwar, Sanjeev, MD;  Location: [facility] OR;  Service: Radiology;  Laterality: N/A;    There were no vitals filed for this visit.      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 No/denies                      OT Treatments/Exercises (OP) - 01/03/17 0001      Neurological Re-education Exercises   Other Exercises 1 Pt issued yellow putty to address grip, pinch and apraxia with RUE. Pt has more active movement than he is currently able to access functionally due to significant apraxia and sensory issues.  Pt needed max cues and  signficant repetition initially however with practice performance greatly improved.  Dtr to assist with vc's at home and able to return demonstrate.  Addressed 2 and 3 point pinch via functional tasks - initially pt needed max assist and cueing however progressed to being able to complete tasks with no facilitation (movement clumsy but improved).  Also addressed functional use of RUE in bilalteral activities.                OT Education - 01/03/17 1708    Education provided Yes   Education Details added putty to HEP as well as Artistfolding laundry   Person(s) Educated Patient   Methods Explanation;Demonstration;Handout   Comprehension Verbalized understanding;Returned demonstration  dtr to cue prn          OT Short Term Goals - 01/03/17 1709      OT SHORT TERM GOAL #1   Title Pt will be mod I with HEP- 01/10/2017   Status On-going     OT SHORT TERM GOAL #2   Title Pt will be mod I with bathing   Status Achieved     OT SHORT TERM GOAL #3   Title Pt will be mod I with shower transfers   Status Achieved     OT SHORT TERM GOAL #4   Title Pt  will be mod I using electric razor   Status Achieved     OT SHORT TERM GOAL #5   Title Pt will feed self at least 50% of meals with RUE with AE prn   Status Achieved     OT SHORT TERM GOAL #6   Title Pt will be able to write first name legibly using AE prn   Status Achieved     OT SHORT TERM GOAL #7   Title Pt will use RUE as gross assist at least 25% of the time  for simple familiar hot meal prep and require no more than supervision    Status On-going           OT Long Term Goals - 01/03/17 1709      OT LONG TERM GOAL #1   Title Pt will be mod I with upgraded HEP- 02/07/2017   Status On-going     OT LONG TERM GOAL #2   Title Pt will eat 100% of meals with RUE with AE prn   Status On-going     OT LONG TERM GOAL #3   Title Pt will use RUE as gross assist at least 50% of the time for hot meal prep and will be mod I   Status  On-going     OT LONG TERM GOAL #4   Title Pt will be able to play simple song on drums using both hands   Status On-going     OT LONG TERM GOAL #5   Title Pt and family will verbalize understanding of driving eval recommendations.    Status On-going     OT LONG TERM GOAL #6   Title Pt will demonstrate ability to write full name with 100% legiblity with AE prn   Status On-going     OT LONG TERM GOAL #7   Title Pt will be able to type short email with no more than 3 errors.    Status On-going     OT LONG TERM GOAL #8   Title Pt will demonstate at least 10 pounds of grip strength to assist with functional tasks   Status On-going               Plan - 01/03/17 1709    Clinical Impression Statement Pt progressing toward goals. Pt benefits from repetition and functional tasks. Pt displaying full digit extension with cueing.    Rehab Potential Good   Current Impairments/barriers affecting progress: aphasis and apraxia   OT Frequency 2x / week   OT Duration 8 weeks   OT Treatment/Interventions Self-care/ADL training;Electrical Stimulation;Therapeutic exercise;Neuromuscular education;DME and/or AE instruction;Passive range of motion;Manual Therapy;Splinting;Therapeutic activities;Patient/family education   Plan simple cooking task, check remaining STG's, functional spontaneous use of RUE, 2 and 3 point pinch, manipulation in hand, hand strengthening and isolated control   Consulted and Agree with Plan of Care Patient      Patient will benefit from skilled therapeutic intervention in order to improve the following deficits and impairments:  Decreased cognition, Decreased coordination, Decreased range of motion, Decreased knowledge of use of DME, Decreased strength, Impaired UE functional use, Other (comment)  Visit Diagnosis: Hemiplegia and hemiparesis following cerebral infarction affecting right dominant side (HCC)  Muscle weakness (generalized)  Other lack of  coordination  Other symptoms and signs involving cognitive functions following cerebral infarction  Apraxia    Problem List Patient Active Problem List   Diagnosis Date Noted  . Internal carotid artery dissection (HCC) 12/10/2016  . Moya  moya disease 12/10/2016  . History of tobacco use 12/10/2016  . Prediabetes 12/06/2016  . Hyperlipidemia 12/06/2016  . CVA (cerebral vascular accident) (HCC) 11/28/2016    Norton PastelPulaski, Karen Halliday, OTR/L 01/03/2017, 5:12 PM  [facility] [address] [place], [place], [address] Phone: [phone]   Fax:  [phone]  Name: Manuel Boyer MRN: [id number] Date of Birth: Aug 22, 1990

## 2017-01-04 NOTE — Patient Instructions (Signed)
  Please complete the assigned speech therapy homework prior to your next session and return it to the speech therapist at your next visit.  

## 2017-01-04 NOTE — Therapy (Signed)
[facility] [address] [place], [place], [address] Phone: [phone]   Fax:  [phone]  Speech Language Pathology Treatment  Patient Details  Name: Manuel GensJamar Boyer MRN: [id number] Date of Birth: 13-Jan-1991 Referring Provider: Gust RungHoffman, Erik C, DO  Encounter Date: 01/03/2017      End of Session - 01/04/17 1357    Visit Number 7   Number of Visits 24   Date for SLP Re-Evaluation 02/09/17   SLP Start Time 1533   SLP Stop Time  1615   SLP Time Calculation (min) 42 min   Activity Tolerance Patient tolerated treatment well      Past Medical History:  Diagnosis Date  . Internal carotid artery dissection (HCC)   . Moya moya disease     Past Surgical History:  Procedure Laterality Date  . IR ANGIO INTRA EXTRACRAN SEL COM CAROTID INNOMINATE BILAT MOD SED  11/28/2016  . IR ANGIO VERTEBRAL SEL VERTEBRAL BILAT MOD SED  11/28/2016  . RADIOLOGY WITH ANESTHESIA N/A 11/28/2016   Procedure: RADIOLOGY WITH ANESTHESIA;  Surgeon: Julieanne Cottoneveshwar, Sanjeev, MD;  Location: [facility] OR;  Service: Radiology;  Laterality: N/A;    There were no vitals filed for this visit.      Subjective Assessment - 01/03/17 1545    Subjective Pt brings homework completed.   Currently in Pain? No/denies               ADULT SLP TREATMENT - 01/04/17 0001      General Information   Behavior/Cognition Alert;Cooperative;Pleasant mood     Cognitive-Linquistic Treatment   Treatment focused on Aphasia   Skilled Treatment SLP reviewed pt's homework with him today, assisted with one response not completed. Mod copmlex conversation ([employer] [place]) for 10 minutes completed functionally, with demonstrated understanding of SLP comments/questions. Pt's simple conversation was functional in today's session.     Assessment / Recommendations / Plan   Plan Continue with current plan of care     Progression Toward Goals   Progression toward goals Progressing toward goals            SLP Short Term Goals - 01/04/17 1358      SLP SHORT TERM GOAL #1   Title Pt will name 8 items for simple categories with occasional min A.   Status Achieved     SLP SHORT TERM GOAL #2   Title pt will demo understanding of 10 minutes of simple-mod complex conversation over two sessions   Time 2   Status Achieved     SLP SHORT TERM GOAL #3   Title pt will generate simple sentences responding to therapy stimuli with of average 5 words over two sessions   Baseline 12-27-16   Time 2   Period Weeks   Status On-going     SLP SHORT TERM GOAL #4   Title Pt will participate in assessment of cognitive communication within first 4 sessions.   Baseline 27/30 on MoCA   Status Achieved          SLP Long Term Goals - 01/03/17 1609      SLP LONG TERM GOAL #1   Title Pt will utilize compensations for aphasia 80% of opportunities in simple conversation with occasional min A   Time 6   Period Weeks   Status On-going     SLP LONG TERM GOAL #2   Title pt will participate functionally in a 10 minute simple conversation in 2 sessions  Time 6   Period Weeks   Status On-going     SLP LONG TERM GOAL #3   Title Pt will demo understanding of 15 min simple to mod complex conversation with repeats allowed x2.   Time 6   Period Weeks   Status On-going     SLP LONG TERM GOAL #4   Title pt will tell SLP of using a memory strategy in home environment x5 times (when asked)   Time 6   Period Weeks   Status New          Plan - 01/04/17 1357    Clinical Impression Statement Pt continues to be motivated in and outside of therapy. He is receptive to suggestions and strategies in session, and follows through at home. Continue ST intervention is recommended to facilitate effective communication, improve quality of life and increase likelihood of return to work.   Speech Therapy Frequency 3x / week   Treatment/Interventions Cueing  hierarchy;Functional tasks;Patient/family education;Multimodal communcation approach;Cognitive reorganization;Language facilitation;Internal/external aids;SLP instruction and feedback;Compensatory techniques   Potential to Achieve Goals Good   Potential Considerations Family/community support;Ability to learn/carryover information;Previous level of function;Cooperation/participation level   SLP Home Exercise Plan reviewed and provided   Consulted and Agree with Plan of Care Patient;Family member/caregiver   Family Member Consulted daughter      Patient will benefit from skilled therapeutic intervention in order to improve the following deficits and impairments:   Aphasia  Apraxia    Problem List Patient Active Problem List   Diagnosis Date Noted  . Internal carotid artery dissection (HCC) 12/10/2016  . Moya moya disease 12/10/2016  . History of tobacco use 12/10/2016  . Prediabetes 12/06/2016  . Hyperlipidemia 12/06/2016  . CVA (cerebral vascular accident) (HCC) 11/28/2016    [facility] HospitalCHINKE,CARL ,MS, CCC-SLP  01/04/2017, 1:59 PM  [facility] [address] [place], [place], [address] Phone: [phone]   Fax:  [phone]   Name: Manuel GensJamar Boyer MRN: [id number] Date of Birth: [birth year]

## 2017-01-05 ENCOUNTER — Encounter: Payer: BLUE CROSS/BLUE SHIELD | Admitting: Occupational Therapy

## 2017-01-05 ENCOUNTER — Ambulatory Visit (INDEPENDENT_AMBULATORY_CARE_PROVIDER_SITE_OTHER): Payer: BLUE CROSS/BLUE SHIELD | Admitting: Neurology

## 2017-01-05 ENCOUNTER — Encounter: Payer: Self-pay | Admitting: Neurology

## 2017-01-05 ENCOUNTER — Ambulatory Visit: Payer: BLUE CROSS/BLUE SHIELD

## 2017-01-05 VITALS — BP 108/66 | HR 66 | Ht 70.0 in | Wt 243.0 lb

## 2017-01-05 DIAGNOSIS — I675 Moyamoya disease: Secondary | ICD-10-CM

## 2017-01-05 DIAGNOSIS — I7771 Dissection of carotid artery: Secondary | ICD-10-CM

## 2017-01-05 NOTE — Progress Notes (Signed)
[facility] [address] [place] [id number] [phone]       OFFICE FOLLOW-UP NOTE  Manuel. Manuel Boyer Date of Birth:  Jun 06, 1990 Medical Record Number:  [id number]   HPI: Manuel Boyer is a [age] year pleasant African-American male seen today for first office follow-up visit following hospital admission for stroke in June 2018. Manuel Boyer is an 26 y.o. male with no significant past medical history. Approximately 5 days prior to admission on 11/28/16 patient noticed some neck discomfort with slurred speech and some right arm weakness. Apparently last night at 2000 hrs. patient was noted it worse and has significant word finding difficulties and right arm weakness. 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 There is also noted hypodensity in the left frontal lobe most compatible with a subacute infarct. The occlusion the left carotid internal artery with a taper lumen as noted above was consistent with a dissection. 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 As far as familypatient does not have sickle cell. 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 Due to the multiple arterial abnormalities secondary to moyamoya and the leptomeningeal collaterals patient is going to be sent to interventional radiology to get a conventional angiogram to further evaluate if this isn't M1 occlusion and if this is possible to intervene on. Date last known well: Date: 11/23/2016 Time last known well: Time: 20:00 tPA Given: No: out of window.. Diagnostic labs were collected and vascular disease, lupus, sickle cell, vasculitic disorders and hypercarbia states were all negative. LDL cholesterol was 104mg  percent. Hb A1c was 5.7. Transthoraxic echo showed normal ejection fraction. Patient did give a  history of habitual neck popping about to 3 times a day. He denied any significant neck injury or chiropractic ventilation in the weeks or months prior to his presentation. Cerebral catheter angiogram performed by Dr. Corliss Skainseveshwar showed occlusion of the terminal right internal carotid artery with dilated leptomeningeal collaterals. The left internal carotid artery was occluded close to its origin with a flame-shaped appearance. Patient was started on dual antiplatelet therapy aspirin and Plavix and on statin for his lipids. He states his done well since discharge. He has not had any problems with bleeding or bruising. His blood pressure is well controlled and today it is [id number]. He is getting outpatient physical occupational speech therapy. His speech is improved but he still has to struggle to find words in complete sentences. Is also noticed diminished fine motor skills in his hand as well as some intermittent tingling and numbness as well. 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 The patient is also getting a [facility] for his moyamoya and carotid dissection and has an appointment to see Dr. Baxter KailEric Haucke in the next few weeks. ROS:   14 system review of systems is positive for activity change, fatigue, facial swelling, trouble swallowing, drooling, cough, chest tightness, headache, numbness, weakness, facial drooping, agitation, decreased concentration, nervousness and anxiety, joint pain and swelling and snoring, speech difficulty  PMH:  Past Medical History:  Diagnosis Date  . Internal carotid artery dissection (HCC)   . Moya moya disease   . Stroke [facility](HCC)     Social History:  Social History   Social  History  . Marital status: Married    Spouse name: N/A  . Number of children: N/A  . Years of education: N/A   Occupational History  . Not on file.   Social History Main Topics   . Smoking status: Former Smoker    Packs/day: 3.00    Types: Cigarettes  . Smokeless tobacco: Never Used  . Alcohol use No  . Drug use: Yes    Types: Marijuana  . Sexual activity: Not on file   Other Topics Concern  . Not on file   Social History Narrative  . No narrative on file    Medications:   Current Outpatient Prescriptions on File Prior to Visit  Medication Sig Dispense Refill  . aspirin 81 MG EC tablet Take 1 tablet (81 mg total) by mouth daily. 90 tablet 0  . clopidogrel (PLAVIX) 75 MG tablet Take 1 tablet (75 mg total) by mouth daily. 60 tablet 0  . pravastatin (PRAVACHOL) 20 MG tablet TAKE 1 TABLET BY MOUTH DAILY AT 6 PM 30 tablet 3   No current facility-administered medications on file prior to visit.     Allergies:  No Known Allergies  Physical Exam General: well developed, well nourished Overweight young African-American male, seated, in no evident distress Head: head normocephalic and atraumatic.  Neck: supple with no carotid or supraclavicular bruits Cardiovascular: regular rate and rhythm, no murmurs Musculoskeletal: no deformity Skin:  no rash/petichiae Vascular:  Normal pulses all extremities Vitals:   01/05/17 1047  BP: 108/66  Pulse: 66   Neurologic Exam Mental Status: Awake and fully alert. Oriented to place and time. Recent and remote memory intact. Attention span, concentration and fund of knowledge appropriate. Mood and affect appropriate. Mild expressive aphasia with slightly nonfluent speech with some word finding difficulties and occasional paraphasic errors. Able to repeat comprehend and name well. Cranial Nerves: Fundoscopic exam reveals sharp disc margins. Pupils equal, briskly reactive to light. Extraocular movements full without nystagmus. Visual fields full to confrontation. Hearing intact. Facial sensation intact. Mild right lower facial asymmetry on smiling., tongue, palate moves normally and symmetrically.  Motor: Normal bulk and  tone. Normal strength in all tested extremity muscles except mild weakness of right grip and fine finger movements in the right hand. Orbits left over right upper extremity.. Sensory.: intact to touch ,pinprick .position and vibratory sensation.  Coordination: Rapid alternating movements normal in all extremities. Finger-to-nose and heel-to-shin performed accurately bilaterally. Gait and Station: Arises from chair without difficulty. Stance is normal. Gait demonstrates normal stride length and balance . Able to heel, toe and tandem walk without difficulty.  Reflexes: 1+ and symmetric. Toes downgoing.   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 Negative workup for vasculitis and hypercoagulable state.    PLAN: I had a long d/w patient, his wife and mother about his recent stroke,left cervical carotid artery dissection, possible moya moya apperaance on angiogram, risk for recurrent stroke/TIAs, personally independently reviewed imaging studies and stroke evaluation results and answered questions.Continue aspirin 81 mg daily and clopidogrel 75 mg daily  X 3 months and then aspirin alone for secondary stroke prevention and maintain strict control of hypertension with blood pressure goal below 130/90, diabetes with hemoglobin A1c goal below 6.5%  and lipids with LDL cholesterol goal below 70 mg/dL. I also advised the patient to eat a healthy diet with plenty of whole grains, cereals, fruits and vegetables, exercise regularly and maintain ideal body weight . Continue ongoing speech and occupational therapy. I advised him to avoid habitual neck popping and any activity which involves  significant straining of neck muscles. Patient has an appointment for a second opinion [facility] and encouraged him to keep that to discuss possible surgical revascularization options for his bilateral carotid occlusion.the patient may return to driving and to work when his short-term disability expires Followup in the future with me in 3 months or call earlier if necessary Greater than 50% of time during this 35 minute visit was spent on counseling,explanation of diagnosis of carotid dissection, moyamoya disease, stroke and aphasia, planning of further management, discussion with patient and family and coordination of care Delia HeadyPramod Meiko Stranahan, MD  [facility] [address] [place], [place] [address]  Phone [phone] Fax [phone] Note: This document was prepared with digital dictation and possible smart phrase technology. Any transcriptional errors that result from this process are unintentional

## 2017-01-05 NOTE — Patient Instructions (Addendum)
I had a long d/w patient, his wife and mother about his recent stroke,left cervical carotid artery dissection, possible moya moya apperaance on angiogram, risk for recurrent stroke/TIAs, personally independently reviewed imaging studies and stroke evaluation results and answered questions.Continue aspirin 81 mg daily and clopidogrel 75 mg daily  X 3 months and then aspirin alone for secondary stroke prevention and maintain strict control of hypertension with blood pressure goal below 130/90, diabetes with hemoglobin A1c goal below 6.5% and lipids with LDL cholesterol goal below 70 mg/dL. I also advised the patient to eat a healthy diet with plenty of whole grains, cereals, fruits and vegetables, exercise regularly and maintain ideal body weight . Continue ongoing speech and occupational therapy. I advised him to avoid habitual neck popping and any activity which involves significant straining of neck muscles. Patient has [facility] and encouraged him to keep that to discuss possible surgical revascularization options for his bilateral carotid occlusion.the patient may return to driving and to work when his short-term disability expires Followup in the future with me in 3 months or call earlier if necessary   Carotid Artery Dissection Carotid artery dissection is a tear in a carotid artery. The carotid arteries are blood vessels on each side of the neck. They carry blood from the heart to the brain and other parts of the head. When an artery tears, blood collects inside the layers of the artery wall. This can cause a blood clot. This condition increases the risk of stroke if it is not diagnosed and treated right away. What are the causes? For many people, the cause of this condition is not known. It may be caused by:  A neck injury due to sudden or excessive neck movement. This is called a traumatic dissection.  Having weak blood vessel walls. The walls may tear even when  no injury occurs (spontaneous dissection).  An angiogram procedure. This is a rare cause of carotid artery dissection.  What increases the risk? Carotid artery dissection is more likely to develop in people who are 7430-28 years old. You may be more likely to have a spontaneous carotid artery dissection if you:  Have high blood pressure.  Have a migraine disorder.  Have certain inherited diseases or connective tissue disorders that weaken the blood vessels.  Have fibromuscular dysplasia.  Abuse amphetamine drugs.  Have had an artery dissection in the past.  What are the signs or symptoms? Symptoms of carotid artery dissection are similar to signs of a stroke. Symptoms may include:  Headache.  Face or neck pain.  Changes in vision.  Weakness on one side of the face or body.  Drooping eyelid.  Loss of taste.  Ringing in the ear.  How is this diagnosed? This condition is diagnosed with tests, such as:  CT angiogram. This test uses a computer to take X-rays of your carotid arteries. A dye may be injected into your blood to show the inside of your blood vessels more clearly.  MRI angiogram. This is a type of MRI that is used to evaluate the blood vessels.  Cerebral angiogram. This test uses X-rays and a dye to show the blood vessels in the brain and neck.  Doppler ultrasound. This test uses sound waves to show the carotid arteries. The test will also show how well blood flows through your arteries.  How is this treated? Treatment for this condition depends on the cause of your carotid artery dissection and your overall health. The  most important goal of treatment is to prevent a stroke. If you are having a stroke, it is important to get treatment quickly. Treatment may include:  Blood thinners. This medicine helps to prevent blood clots. This may be given first through an IV tube, and then as pills for 3-6 months.  Procedures to widen a narrow blood vessel (angioplasty)  or to place a mesh tube (stent) inside the blood vessel to keep it open.  Surgery to repair the area. This is rarely needed.  Follow these instructions at home:  Work with your health care provider to control your blood pressure. This may involve: ? Exercising regularly, as told by your health care provider. Check with your health care provider before starting any new type of exercise. ? Eating a heart-healthy diet. This includes limiting unhealthy fats and eating more healthy fats. ? Reducing the amount of salt (sodium) that you eat to less than 1,500 mg a day. ? Reducing stress by participating in things that you enjoy and avoiding things that cause you stress.  Avoid activities that put you at risk for neck injuries, such as contact sports.  Take over-the-counter and prescription medicines only as told by your health care provider.  Do not use any tobacco products, such as cigarettes, chewing tobacco, and e-cigarettes. If you need help quitting, ask your health care provider.  Keep all follow-up visits as told by your health care provider. This is important. Get help right away if:  You feel weak or dizzy.  You have a sudden, severe headache with no known cause.  You notice changes in your vision or speech.  You have difficulty breathing.  You have a loss of balance or coordination.  You have numbness in your face, arm, or leg.  You have chest pain.  You have a fever. These symptoms may represent a serious problem that is an emergency. Do not wait to see if the symptoms will go away. Get medical help right away. Call your local emergency services (911 in the [place] Do not drive yourself to the hospital. This information is not intended to replace advice given to you by your health care provider. Make sure you discuss any questions you have with your health care provider. Document Released: 08/14/2011 Document Revised: 11/02/2015 Document Reviewed: 08/18/2015 Elsevier  Interactive Patient Education  [employer]2018 Elsevier Inc.

## 2017-01-08 ENCOUNTER — Encounter: Payer: Self-pay | Admitting: Occupational Therapy

## 2017-01-08 ENCOUNTER — Ambulatory Visit: Payer: BLUE CROSS/BLUE SHIELD | Admitting: Occupational Therapy

## 2017-01-08 ENCOUNTER — Ambulatory Visit: Payer: BLUE CROSS/BLUE SHIELD | Admitting: Speech Pathology

## 2017-01-08 DIAGNOSIS — R482 Apraxia: Secondary | ICD-10-CM

## 2017-01-08 DIAGNOSIS — R278 Other lack of coordination: Secondary | ICD-10-CM

## 2017-01-08 DIAGNOSIS — I69351 Hemiplegia and hemiparesis following cerebral infarction affecting right dominant side: Secondary | ICD-10-CM | POA: Diagnosis not present

## 2017-01-08 DIAGNOSIS — M6281 Muscle weakness (generalized): Secondary | ICD-10-CM

## 2017-01-08 DIAGNOSIS — R4701 Aphasia: Secondary | ICD-10-CM

## 2017-01-08 DIAGNOSIS — I69318 Other symptoms and signs involving cognitive functions following cerebral infarction: Secondary | ICD-10-CM

## 2017-01-08 NOTE — Therapy (Signed)
[facility] [address] [place], [place], [address] Phone: [phone]   Fax:  [phone]  Occupational Therapy Treatment  Patient Details  Name: Manuel Boyer MRN: [id number] Date of Birth: January 13, 1991 Referring Provider: Dr. Carlynn Purl  Encounter Date: 01/08/2017      OT End of Session - 01/08/17 1643    Visit Number 8   Number of Visits 16   Date for OT Re-Evaluation 02/07/17   Authorization Type [employer] 35 visits for OT   Authorization - Visit Number 8   Authorization - Number of Visits 35   OT Start Time 1535   OT Stop Time 1620   OT Time Calculation (min) 45 min   Activity Tolerance Patient tolerated treatment well   Behavior During Therapy [facility] for tasks assessed/performed      Past Medical History:  Diagnosis Date  . Internal carotid artery dissection (HCC)   . Moya moya disease   . Stroke [facility])     Past Surgical History:  Procedure Laterality Date  . IR ANGIO INTRA EXTRACRAN SEL COM CAROTID INNOMINATE BILAT MOD SED  11/28/2016  . IR ANGIO VERTEBRAL SEL VERTEBRAL BILAT MOD SED  11/28/2016  . RADIOLOGY WITH ANESTHESIA N/A 11/28/2016   Procedure: RADIOLOGY WITH ANESTHESIA;  Surgeon: Julieanne Cotton, MD;  Location: [facility] OR;  Service: Radiology;  Laterality: N/A;    There were no vitals filed for this visit.      Subjective Assessment - 01/08/17 1543    Subjective  Went to [facility].     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 No/denies   Pain Score 0-No pain                      OT Treatments/Exercises (OP) - 01/08/17 0001      ADLs   Work Patient asking about when he can return to work.  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  Patient has been cleared by Neurology to return to work and driving  once short term disability runs out.  Patient advised to seek driving evaluation, patient with apraxia, and slowed reaction time which could influence driving ability.  Also gave patient resource number for local vocational rehab office.       Fine Motor Coordination   Other Fine Motor Exercises Patient with improving coordination in right hand - although still dense sensory impairment.  Attempted 9 hole peg test.  Patient unable to pick up a peg with right hand upon evaluation.  Patient now able to maneuver 4 pegs into place in approximately 2 minutes.  Patient did better obtaining pegs from upright position with cueing to maintain light contact.       Neurological Re-education Exercises   Other Exercises 1 Patient able to juggle with 2 balls after initial coaching.  Patient able to change height, speed and direction with minimal errors.                  OT Education - 01/08/17 1643    Education provided Yes   Education Details driving evaluation resources, vocational rehab   [employer]) Educated Patient;Child(ren)   Methods Explanation;Demonstration;Handout   Comprehension Verbalized understanding          OT Short Term Goals - 01/08/17 1644  OT SHORT TERM GOAL #1   Title Pt will be mod I with HEP- 01/10/2017   Status Achieved     OT SHORT TERM GOAL #2   Title Pt will be mod I with bathing   Status Achieved     OT SHORT TERM GOAL #3   Title Pt will be mod I with shower transfers   Status Achieved     OT SHORT TERM GOAL #4   Title Pt will be mod I using electric razor   Status Achieved     OT SHORT TERM GOAL #5   Title Pt will feed self at least 50% of meals with RUE with AE prn   Status Achieved     OT SHORT TERM GOAL #6   Title Pt will be able to write first name legibly using AE prn   Status Achieved     OT SHORT TERM GOAL #7   Title Pt will use RUE as gross assist at least 25% of the time  for simple familiar hot meal prep and require no more than  supervision    Status On-going           OT Long Term Goals - 01/03/17 1709      OT LONG TERM GOAL #1   Title Pt will be mod I with upgraded HEP- 02/07/2017   Status On-going     OT LONG TERM GOAL #2   Title Pt will eat 100% of meals with RUE with AE prn   Status On-going     OT LONG TERM GOAL #3   Title Pt will use RUE as gross assist at least 50% of the time for hot meal prep and will be mod I   Status On-going     OT LONG TERM GOAL #4   Title Pt will be able to play simple song on drums using both hands   Status On-going     OT LONG TERM GOAL #5   Title Pt and family will verbalize understanding of driving eval recommendations.    Status On-going     OT LONG TERM GOAL #6   Title Pt will demonstrate ability to write full name with 100% legiblity with AE prn   Status On-going     OT LONG TERM GOAL #7   Title Pt will be able to type short email with no more than 3 errors.    Status On-going     OT LONG TERM GOAL #8   Title Pt will demonstate at least 10 pounds of grip strength to assist with functional tasks   Status On-going               Plan - 01/08/17 1644    Clinical Impression Statement Patient showing steady improvement and progress toward OT goals.     Rehab Potential Good   Current Impairments/barriers affecting progress: aphasis and apraxia   OT Frequency 2x / week   OT Duration 8 weeks   OT Treatment/Interventions Self-care/ADL training;Electrical Stimulation;Therapeutic exercise;Neuromuscular education;DME and/or AE instruction;Passive range of motion;Manual Therapy;Splinting;Therapeutic activities;Patient/family education   OT Home Exercise Plan addedd ball handling exercises with tennis ball   Consulted and Agree with Plan of Care Patient      Patient will benefit from skilled therapeutic intervention in order to improve the following deficits and impairments:  Decreased cognition, Decreased coordination, Decreased range of motion, Decreased  knowledge of use of DME, Decreased strength, Impaired UE functional use, Other (comment)  Visit Diagnosis:  Apraxia  Hemiplegia and hemiparesis following cerebral infarction affecting right dominant side (HCC)  Muscle weakness (generalized)  Other lack of coordination  Other symptoms and signs involving cognitive functions following cerebral infarction    Problem List Patient Active Problem List   Diagnosis Date Noted  . Internal carotid artery dissection (HCC) 12/10/2016  . Moya moya disease 12/10/2016  . History of tobacco use 12/10/2016  . Prediabetes 12/06/2016  . Hyperlipidemia 12/06/2016  . CVA (cerebral vascular accident) [facility](HCC) 11/28/2016    Collier SalinaGellert, Kristin M , OTR/L 01/08/2017, 4:45 PM  [facility] [address] [place], [place], [address] Phone: [phone]   Fax:  [phone]  Name: Manuel Boyer MRN: [id number] Date of Birth: August 29, 1990

## 2017-01-08 NOTE — Patient Instructions (Signed)
Local Driver Evaluation Programs: ° °Comprehensive Evaluation: includes clinical and in vehicle behind the wheel testing by OCCUPATIONAL THERAPIST. Programs have varying levels of adaptive controls available for trial.  ° °[facility] °[address] °[place], [place]  [address] °[phone] or [phone] °http://www.driver-rehab.com °Evaluator:  Cyndee Crompton, OT/CDRS/CDI/SCDCM/Low Vision Certification ° °[facility] °[address] °[place], [place] [address] °[phone] °https://www.[facility].org/home/services/rehabilitation.aspx °Evaluators:  Shannon Sheek, OT and Jill Tucker, OT ° °[facility] [place] (ONLY SERVES VETERANS!!) °[facility] °[address] °[place], [place]  [address] °[phone] [phone] °http://www.[place].va.gov/services/Physical_Medicine_Rehabilitation_Services.asp °Evaluators:  Eric Andrews, KT; Heidi Harris, KT;  Gary Whitaker, KT (KT=kiniesotherapist) ° ° °Clinical evaluations only:  Includes clinical testing, refers to other programs or local certified driving instructor for behind the wheel testing. ° °[facility] (outpatient Rehab) °[facility] °[address] °[place], [place] [address] °[phone] for scheduling °http://www.[facility].edu/Outpatient-Rehabilitation/Neurorehabilitation-Therapy.htm °Evaluators:  Kelly Lambeth, OT; Kate Phillips, OT ° °Other area clinical evaluators available upon request including [facility], [facility] and [facility]. ° ° °    Resource List °What is a Driver Evaluation: °Your Road Ahead - A Guide to Comprehensive Driving Evaluations °http://www.thehartford.com/resources/mature-market-excellence/publications-on-aging ° °Association for [employer] - Disability and Driving Fact Sheets °http://www.aded.net/?page=510 ° °Driving after a Brain  Injury: °[employer] °http://www.biausa.org/tbims-abstracts/if-there-is-an-effective-way-to-determine-if-someone-is-ready-to-drive-after-tbi?A=SearchResult&SearchID=9495675&ObjectID=2758842&ObjectType=35 ° °Driving with Adaptive Equipment: °[facility] °http://www.driver-rehab.com/adaptive-equipment ° °[employer] °http://www.nmeda.com/ ° ° ° ° ° ° °  °

## 2017-01-08 NOTE — Therapy (Signed)
[facility] [address] [place], [place], [address] Phone: [phone]   Fax:  [phone]  Speech Language Pathology Treatment  Patient Details  Name: Manuel Boyer MRN: [id number] Date of Birth: December 15, 1990 Referring Provider: Gust Rung, DO  Encounter Date: 01/08/2017      End of Session - 01/08/17 1838    Visit Number 8   Number of Visits 24   Date for SLP Re-Evaluation 02/09/17   SLP Start Time 1649   SLP Stop Time  1734   SLP Time Calculation (min) 45 min   Activity Tolerance Patient tolerated treatment well      Past Medical History:  Diagnosis Date  . Internal carotid artery dissection (HCC)   . Moya moya disease   . Stroke [facility])     Past Surgical History:  Procedure Laterality Date  . IR ANGIO INTRA EXTRACRAN SEL COM CAROTID INNOMINATE BILAT MOD SED  11/28/2016  . IR ANGIO VERTEBRAL SEL VERTEBRAL BILAT MOD SED  11/28/2016  . RADIOLOGY WITH ANESTHESIA N/A 11/28/2016   Procedure: RADIOLOGY WITH ANESTHESIA;  Surgeon: Julieanne Cotton, MD;  Location: [facility] OR;  Service: Radiology;  Laterality: N/A;    There were no vitals filed for this visit.      Subjective Assessment - 01/08/17 1833    Subjective "We did a lot of engaging activities" re: weekend   Patient is accompained by: Family member  daughter   Currently in Pain? No/denies               ADULT SLP TREATMENT - 01/08/17 1445      General Information   Behavior/Cognition Alert;Cooperative;Pleasant mood   Patient Positioning Upright in chair   Oral care provided N/A     Treatment Provided   Treatment provided Cognitive-Linquistic     Cognitive-Linquistic Treatment   Treatment focused on Aphasia   Skilled Treatment SLP reviewed pt's homework with him. SLP assisted with error awareness at sentence level and facilitated simple-moderately complex sentence level responses with mod A. Pt benefitted from cues to write semantically  related words and descriptions to aid in coherent sentence formation. Provided education re: community resources for aphasia. Pt states he has been cleared by Neurology for driving; provided pt with wallet card with info re: impacts of pt's aphasia on his language expression/comprehension.      Assessment / Recommendations / Plan   Plan Continue with current plan of care     Progression Toward Goals   Progression toward goals Progressing toward goals          SLP Education - 01/08/17 1838    Education provided Yes   Education Details community resources for aphasia   Person(s) Educated Patient;Child(ren)   Methods Explanation   Comprehension Verbalized understanding          SLP Short Term Goals - 01/08/17 1839      SLP SHORT TERM GOAL #1   Title Pt will name 8 items for simple categories with occasional min A.   Status Achieved     SLP SHORT TERM GOAL #2   Title pt will demo understanding of 10 minutes of simple-mod complex conversation over two sessions   Status Achieved     SLP SHORT TERM GOAL #3   Title pt will generate simple sentences responding to therapy stimuli with of average 5 words over two sessions   Baseline 12-27-16   Time 1   Period Weeks   Status On-going  SLP SHORT TERM GOAL #4   Title Pt will participate in assessment of cognitive communication within first 4 sessions.   Status Achieved          SLP Long Term Goals - 01/08/17 1840      SLP LONG TERM GOAL #1   Title Pt will utilize compensations for aphasia 80% of opportunities in simple conversation with occasional min A   Time 5   Period Weeks   Status On-going     SLP LONG TERM GOAL #2   Title pt will participate functionally in a 10 minute simple conversation in 2 sessions   Time 5   Period Weeks   Status On-going     SLP LONG TERM GOAL #3   Title Pt will demo understanding of 15 min simple to mod complex conversation with repeats allowed x2.   Time 5   Period Weeks   Status  On-going     SLP LONG TERM GOAL #4   Title pt will tell SLP of using a memory strategy in home environment x5 times (when asked)   Time 5   Period Weeks   Status On-going          Plan - 01/08/17 1839    Clinical Impression Statement Pt continues to be motivated in and outside of therapy. He is receptive to suggestions and strategies in session, and follows through at home. Continue ST intervention is recommended to facilitate effective communication, improve quality of life and increase likelihood of return to work.   Speech Therapy Frequency 3x / week   Treatment/Interventions Cueing hierarchy;Functional tasks;Patient/family education;Multimodal communcation approach;Cognitive reorganization;Language facilitation;Internal/external aids;SLP instruction and feedback;Compensatory techniques   Potential to Achieve Goals Good   Potential Considerations Family/community support;Ability to learn/carryover information;Previous level of function;Cooperation/participation level   SLP Home Exercise Plan reviewed and provided   Consulted and Agree with Plan of Care Patient;Family member/caregiver   Family Member Consulted daughter      Patient will benefit from skilled therapeutic intervention in order to improve the following deficits and impairments:   Aphasia  Apraxia    Problem List Patient Active Problem List   Diagnosis Date Noted  . Internal carotid artery dissection (HCC) 12/10/2016  . Moya moya disease 12/10/2016  . History of tobacco use 12/10/2016  . Prediabetes 12/06/2016  . Hyperlipidemia 12/06/2016  . CVA (cerebral vascular accident) (HCC) 11/28/2016   Rondel BatonMary Beth Shalina Norfolk, MS, CCC-SLP Speech-Language Pathologist   Arlana LindauMary E Demarrio Menges 01/08/2017, 6:40 PM  [facility] [address] [place], [place], [address] Phone: [phone]   Fax:  [phone]   Name: Manuel Boyer MRN: [id number] Date of Birth:  1991-04-05

## 2017-01-10 ENCOUNTER — Encounter: Payer: BLUE CROSS/BLUE SHIELD | Admitting: Occupational Therapy

## 2017-01-10 ENCOUNTER — Ambulatory Visit: Payer: BLUE CROSS/BLUE SHIELD

## 2017-01-10 DIAGNOSIS — I69351 Hemiplegia and hemiparesis following cerebral infarction affecting right dominant side: Secondary | ICD-10-CM | POA: Diagnosis not present

## 2017-01-10 DIAGNOSIS — R482 Apraxia: Secondary | ICD-10-CM

## 2017-01-10 DIAGNOSIS — R4701 Aphasia: Secondary | ICD-10-CM

## 2017-01-11 NOTE — Patient Instructions (Signed)
  Please complete the assigned speech therapy homework prior to your next session and return it to the speech therapist at your next visit.  

## 2017-01-11 NOTE — Therapy (Signed)
[facility] [address] [place], [place], [address] Phone: [phone]   Fax:  [phone]  Speech Language Pathology Treatment  Patient Details  Name: Manuel Boyer MRN: [id number] Date of Birth: 1991-01-24 Referring Provider: Gust RungHoffman, Erik C, DO  Encounter Date: 01/10/2017      End of Session - 01/11/17 1241    Visit Number 9   Number of Visits 24   Date for SLP Re-Evaluation 02/09/17   SLP Start Time 1405   SLP Stop Time  1445   SLP Time Calculation (min) 40 min   Activity Tolerance Patient tolerated treatment well      Past Medical History:  Diagnosis Date  . Internal carotid artery dissection (HCC)   . Moya moya disease   . Stroke [facility](HCC)     Past Surgical History:  Procedure Laterality Date  . IR ANGIO INTRA EXTRACRAN SEL COM CAROTID INNOMINATE BILAT MOD SED  11/28/2016  . IR ANGIO VERTEBRAL SEL VERTEBRAL BILAT MOD SED  11/28/2016  . RADIOLOGY WITH ANESTHESIA N/A 11/28/2016   Procedure: RADIOLOGY WITH ANESTHESIA;  Surgeon: Julieanne Cottoneveshwar, Sanjeev, MD;  Location: [facility] OR;  Service: Radiology;  Laterality: N/A;    There were no vitals filed for this visit.      Subjective Assessment - 01/10/17 1411    Subjective "It's (my speech) good if I slow down."   Patient is accompained by: Junie Panning--  Anya, daughter   Currently in Pain? No/denies               ADULT SLP TREATMENT - 01/11/17 0001      General Information   Behavior/Cognition Alert;Cooperative;Pleasant mood     Treatment Provided   Treatment provided Cognitive-Linquistic     Cognitive-Linquistic Treatment   Treatment focused on Aphasia   Skilled Treatment Pt's speech appears more fluid than last week prior to focused practice on this during session. In strucutred tasks requirng multisentence responses pt's speech remained more fluid. SLP incr'd the complexity and walked outside with pt during conversation; pt remained functional in conversation  for 10 minutes.       Assessment / Recommendations / Plan   Plan Continue with current plan of care     Progression Toward Goals   Progression toward goals Progressing toward goals            SLP Short Term Goals - 01/11/17 1243      SLP SHORT TERM GOAL #1   Title Pt will name 8 items for simple categories with occasional min A.   Status Achieved     SLP SHORT TERM GOAL #2   Title pt will demo understanding of 10 minutes of simple-mod complex conversation over two sessions   Status Achieved     SLP SHORT TERM GOAL #3   Title pt will generate simple sentences responding to therapy stimuli with of average 5 words over two sessions   Status Achieved     SLP SHORT TERM GOAL #4   Title Pt will participate in assessment of cognitive communication within first 4 sessions.   Status Achieved          SLP Long Term Goals - 01/11/17 1244      SLP LONG TERM GOAL #1   Title Pt will utilize compensations for aphasia 80% of opportunities in simple conversation with occasional min A   Status Achieved     SLP LONG TERM GOAL #2   Title pt will participate functionally in a 15 minute  mod complex conversation in 2 sessions   Time 5   Period Weeks   Status Revised     SLP LONG TERM GOAL #3   Title Pt will demo understanding of 15 min simple to mod complex conversation with repeats allowed x2.   Baseline 01-10-17   Time 5   Period Weeks   Status On-going     SLP LONG TERM GOAL #4   Title pt will tell SLP of using a memory strategy in home environment x5 times (when asked)   Time 5   Period Weeks   Status On-going          Plan - 01/11/17 1241    Clinical Impression Statement Pt continues with high motivation for positive change in his speech. Pt's success with speech and language fluidity in conversation is improving. Continue ST intervention is recommended to facilitate effective communication, improve quality of life and increase likelihood of return to work.   Speech  Therapy Frequency 3x / week   Treatment/Interventions Cueing hierarchy;Functional tasks;Patient/family education;Multimodal communcation approach;Cognitive reorganization;Language facilitation;Internal/external aids;SLP instruction and feedback;Compensatory techniques   Potential to Achieve Goals Good   Potential Considerations Family/community support;Ability to learn/carryover information;Previous level of function;Cooperation/participation level   SLP Home Exercise Plan reviewed and provided   Consulted and Agree with Plan of Care Patient;Family member/caregiver   Family Member Consulted daughter      Patient will benefit from skilled therapeutic intervention in order to improve the following deficits and impairments:   Aphasia  Apraxia    Problem List Patient Active Problem List   Diagnosis Date Noted  . Internal carotid artery dissection (HCC) 12/10/2016  . Moya moya disease 12/10/2016  . History of tobacco use 12/10/2016  . Prediabetes 12/06/2016  . Hyperlipidemia 12/06/2016  . CVA (cerebral vascular accident) (HCC) 11/28/2016    [facility] ,MS, CCC-SLP  01/11/2017, 12:45 PM  [place] [facility] [address] [place], [place], [address] Phone: [phone]   Fax:  [phone]   Name: Manuel Boyer MRN: [id number] Date of Birth: 11/23/1990

## 2017-01-12 ENCOUNTER — Encounter: Payer: Self-pay | Admitting: Occupational Therapy

## 2017-01-12 ENCOUNTER — Ambulatory Visit: Payer: BLUE CROSS/BLUE SHIELD | Admitting: Occupational Therapy

## 2017-01-12 ENCOUNTER — Ambulatory Visit: Payer: BLUE CROSS/BLUE SHIELD

## 2017-01-12 DIAGNOSIS — I69351 Hemiplegia and hemiparesis following cerebral infarction affecting right dominant side: Secondary | ICD-10-CM | POA: Diagnosis not present

## 2017-01-12 DIAGNOSIS — R482 Apraxia: Secondary | ICD-10-CM

## 2017-01-12 DIAGNOSIS — R4701 Aphasia: Secondary | ICD-10-CM

## 2017-01-12 DIAGNOSIS — R278 Other lack of coordination: Secondary | ICD-10-CM

## 2017-01-12 DIAGNOSIS — M6281 Muscle weakness (generalized): Secondary | ICD-10-CM

## 2017-01-12 DIAGNOSIS — I69318 Other symptoms and signs involving cognitive functions following cerebral infarction: Secondary | ICD-10-CM

## 2017-01-12 NOTE — Therapy (Signed)
[facility] [address] [place], [place], [address] Phone: [phone]   Fax:  [phone]  Speech Language Pathology Treatment  Patient Details  Name: Manuel Boyer MRN: [id number] Date of Birth: Jun 04, 1991 Referring Provider: Gust Rung, DO  Encounter Date: 01/12/2017      End of Session - 01/12/17 1404    Visit Number 10   Number of Visits 24   Date for SLP Re-Evaluation 02/09/17   SLP Start Time 1317   SLP Stop Time  1400   SLP Time Calculation (min) 43 min   Activity Tolerance Patient tolerated treatment well      Past Medical History:  Diagnosis Date  . Internal carotid artery dissection (HCC)   . Moya moya disease   . Stroke [facility])     Past Surgical History:  Procedure Laterality Date  . IR ANGIO INTRA EXTRACRAN SEL COM CAROTID INNOMINATE BILAT MOD SED  11/28/2016  . IR ANGIO VERTEBRAL SEL VERTEBRAL BILAT MOD SED  11/28/2016  . RADIOLOGY WITH ANESTHESIA N/A 11/28/2016   Procedure: RADIOLOGY WITH ANESTHESIA;  Surgeon: Julieanne Cotton, MD;  Location: [facility] OR;  Service: Radiology;  Laterality: N/A;    There were no vitals filed for this visit.      Subjective Assessment - 01/12/17 1321    Subjective Pt reports things going just as well as last time with his speech.   Patient is accompained by: Junie Panning, daughter   Currently in Pain? No/denies               ADULT SLP TREATMENT - 01/12/17 1355      General Information   Behavior/Cognition Alert;Cooperative;Pleasant mood     Treatment Provided   Treatment provided Cognitive-Linquistic     Cognitive-Linquistic Treatment   Treatment focused on Aphasia   Skilled Treatment Pt rates his speech comparable to last session, approx 65% of WNL (100%). Pt's written homework had errors x2 (pt unaware), and, daughter corrected pt x2.  Out doors, pt was very fluent with extra time consistently, even with mod complex-complex topics (his kids  starting school). Pt wrote sentences given a phrase, with 100% success.      Assessment / Recommendations / Plan   Plan Continue with current plan of care     Progression Toward Goals   Progression toward goals Progressing toward goals            SLP Short Term Goals - 01/11/17 1243      SLP SHORT TERM GOAL #1   Title Pt will name 8 items for simple categories with occasional min A.   Status Achieved     SLP SHORT TERM GOAL #2   Title pt will demo understanding of 10 minutes of simple-mod complex conversation over two sessions   Status Achieved     SLP SHORT TERM GOAL #3   Title pt will generate simple sentences responding to therapy stimuli with of average 5 words over two sessions   Status Achieved     SLP SHORT TERM GOAL #4   Title Pt will participate in assessment of cognitive communication within first 4 sessions.   Status Achieved          SLP Long Term Goals - 01/12/17 1406      SLP LONG TERM GOAL #1   Title Pt will utilize compensations for aphasia 80% of opportunities in simple conversation with occasional min A   Status Achieved     SLP LONG TERM  GOAL #2   Title pt will participate functionally in a 15 minute mod complex conversation in 2 sessions   Baseline 01-12-17   Time 5   Period Weeks   Status Revised     SLP LONG TERM GOAL #3   Title Pt will demo understanding of 15 min simple to mod complex conversation with repeats allowed x2.   Time 5   Period Weeks   Status Achieved     SLP LONG TERM GOAL #4   Title pt will tell SLP of using a memory strategy in home environment x5 times (when asked)   Time 5   Period Weeks   Status On-going     SLP LONG TERM GOAL #5   Title pt will demo [facility] speech/language in a mod complex/complex conversation of 20 minutes   Time 5   Period Weeks   Status New          Plan - 01/12/17 1405    Clinical Impression Statement Pt continues with high motivation for positive change in his speech. Pt's success with  speech and language fluidity in conversation is improving. Continue ST intervention is recommended to facilitate effective communication, improve quality of life and increase likelihood of return to work.   Speech Therapy Frequency 3x / week   Duration --  8 weeks   Treatment/Interventions Cueing hierarchy;Functional tasks;Patient/family education;Multimodal communcation approach;Cognitive reorganization;Language facilitation;Internal/external aids;SLP instruction and feedback;Compensatory techniques   Potential to Achieve Goals Good   Potential Considerations Family/community support;Ability to learn/carryover information;Previous level of function;Cooperation/participation level      Patient will benefit from skilled therapeutic intervention in order to improve the following deficits and impairments:   Aphasia  Apraxia    Problem List Patient Active Problem List   Diagnosis Date Noted  . Internal carotid artery dissection (HCC) 12/10/2016  . Moya moya disease 12/10/2016  . History of tobacco use 12/10/2016  . Prediabetes 12/06/2016  . Hyperlipidemia 12/06/2016  . CVA (cerebral vascular accident) (HCC) 11/28/2016    [facility]INKE,Matea Stanard ,MS, CCC-SLP  01/12/2017, 2:07 PM  [facility] [address] [place], [place], [address] Phone: [phone]   Fax:  [phone]   Name: Manuel GensJamar Boyer MRN: [id number] Date of Birth: 05-Apr-1991

## 2017-01-12 NOTE — Therapy (Signed)
[facility] [address] [place], [place], [address] Phone: [phone]   Fax:  [phone]  Occupational Therapy Treatment  Patient Details  Name: Manuel Boyer MRN: [id number] Date of Birth: [birth year] Referring Provider: Dr. Joni Reining  Encounter Date: 01/12/2017      OT End of Session - 01/12/17 1541    Visit Number 9   Number of Visits 16   Date for OT Re-Evaluation 02/07/17   Authorization Type [employer] 35 visits for OT   Authorization - Visit Number 9   Authorization - Number of Visits 35   OT Start Time 1401   OT Stop Time 1445   OT Time Calculation (min) 44 min   Activity Tolerance Patient tolerated treatment well      Past Medical History:  Diagnosis Date  . Internal carotid artery dissection ([place])   . Moya moya disease   . Stroke [facility])     Past Surgical History:  Procedure Laterality Date  . IR ANGIO INTRA EXTRACRAN SEL COM CAROTID INNOMINATE BILAT MOD SED  11/28/2016  . IR ANGIO VERTEBRAL SEL VERTEBRAL BILAT MOD SED  11/28/2016  . RADIOLOGY WITH ANESTHESIA N/A 11/28/2016   Procedure: RADIOLOGY WITH ANESTHESIA;  Surgeon: Luanne Bras, MD;  Location: [facility];  Service: Radiology;  Laterality: N/A;    There were no vitals filed for this visit.      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 No/denies                      OT Treatments/Exercises (OP) - 01/12/17 0001      ADLs   Cooking Addressed simple familiar hot meal prep with emphasis on using RUE as much as possible - pt used RUE 100% of the time and used RUE as eiher dominant or non dominant duirng activity. Motor planning  improves with familiar functional task.  Pt also demonstrated good safety and organization during the task. Pt also used RUE with normal movement patterns with washing dishes and wiping down counters.     Writing Addressed writing - pt was using red foam and today was able to use brown foam without difficulty and with 100% legibility for full name. Pt able to use pen grip with moderate difficulty - discussed using brown foam when writing needs to be fully legible and using pen grip to practice writng as this will help to facilitate pinch.  Pt verbalized understanding.                    OT Short Term Goals - 01/12/17 1537      OT SHORT TERM GOAL #1   Title Pt will be mod I with HEP- 01/10/2017   Status Achieved     OT SHORT TERM GOAL #2   Title Pt will be mod I with bathing   Status Achieved     OT SHORT TERM GOAL #3   Title Pt will be mod I with shower transfers   Status Achieved     OT SHORT TERM GOAL #4   Title Pt will be mod I using Copy  Status Achieved     OT SHORT TERM GOAL #5   Title Pt will feed self at least 50% of meals with RUE with AE prn   Status Achieved     OT SHORT TERM GOAL #6   Title Pt will be able to write first name legibly using AE prn   Status Achieved     OT SHORT TERM GOAL #7   Title Pt will use RUE as gross assist at least 25% of the time  for simple familiar hot meal prep and require no more than supervision    Status Achieved           OT Long Term Goals - 01/12/17 1538      OT LONG TERM GOAL #1   Title Pt will be mod I with upgraded HEP- 02/07/2017   Status On-going     OT LONG TERM GOAL #2   Title Pt will eat 100% of meals with RUE with AE prn   Status Achieved     OT LONG TERM GOAL #3   Title Pt will use RUE as dominant at least 75% of the time for home mgmt tasks.    Status Revised     OT LONG TERM GOAL #4   Title Pt will be able to play simple song on drums using both hands   Status On-going     OT LONG TERM  GOAL #5   Title Pt and family will verbalize understanding of driving eval recommendations.    Status Achieved     OT LONG TERM GOAL #6   Title Pt will demonstrate ability to write full sentence with 100% legiblity using pen grip only   Status Revised     OT LONG TERM GOAL #7   Title Pt will be able to type short email with no more than 3 errors.    Status On-going     OT LONG TERM GOAL #8   Title Pt will demonstate at least 18  pounds of grip strength to assist with functional tasks   Status Revised               Plan - 01/12/17 1540    Clinical Impression Statement Pt has met all STG's and is now progressing toward LTG"s.  Some LTG's were revised given pt's excellent progress. Pt with much brighter affect today.   Rehab Potential Good   Current Impairments/barriers affecting progress: aphasis and apraxia   OT Treatment/Interventions Self-care/ADL training;Electrical Stimulation;Therapeutic exercise;Neuromuscular education;DME and/or AE instruction;Passive range of motion;Manual Therapy;Splinting;Therapeutic activities;Patient/family education   Plan writing with pen grip, functional use of RUE, pinch and grip strength, manipulation of items in hand, isolated control, hand orientation   Consulted and Agree with Plan of Care Patient      Patient will benefit from skilled therapeutic intervention in order to improve the following deficits and impairments:  Decreased cognition, Decreased coordination, Decreased range of motion, Decreased knowledge of use of DME, Decreased strength, Impaired UE functional use, Other (comment)  Visit Diagnosis: Apraxia  Hemiplegia and hemiparesis following cerebral infarction affecting right dominant side (HCC)  Muscle weakness (generalized)  Other lack of coordination  Other symptoms and signs involving cognitive functions following cerebral infarction    Problem List Patient Active Problem List   Diagnosis Date Noted  . Internal  carotid artery dissection ([place]) 12/10/2016  . Moya moya disease 12/10/2016  . History of tobacco use 12/10/2016  . Prediabetes 12/06/2016  . Hyperlipidemia 12/06/2016  . CVA (  cerebral vascular accident) [facility]) 11/28/2016    Quay Burow, OTR/L 01/12/2017, 3:42 PM  [facility] [address] [place], [place], [address] Phone: [phone]   Fax:  [phone]  Name: Manuel Boyer MRN: [id number] Date of Birth: [birth year]

## 2017-01-12 NOTE — Patient Instructions (Signed)
  Please complete the assigned speech therapy homework prior to your next session and return it to the speech therapist at your next visit.  

## 2017-01-15 ENCOUNTER — Ambulatory Visit: Payer: BLUE CROSS/BLUE SHIELD | Admitting: Speech Pathology

## 2017-01-15 ENCOUNTER — Ambulatory Visit: Payer: BLUE CROSS/BLUE SHIELD | Admitting: Occupational Therapy

## 2017-01-15 ENCOUNTER — Encounter: Payer: Self-pay | Admitting: Occupational Therapy

## 2017-01-15 DIAGNOSIS — R4701 Aphasia: Secondary | ICD-10-CM

## 2017-01-15 DIAGNOSIS — I69318 Other symptoms and signs involving cognitive functions following cerebral infarction: Secondary | ICD-10-CM

## 2017-01-15 DIAGNOSIS — R41841 Cognitive communication deficit: Secondary | ICD-10-CM

## 2017-01-15 DIAGNOSIS — I69351 Hemiplegia and hemiparesis following cerebral infarction affecting right dominant side: Secondary | ICD-10-CM

## 2017-01-15 DIAGNOSIS — R278 Other lack of coordination: Secondary | ICD-10-CM

## 2017-01-15 DIAGNOSIS — R482 Apraxia: Secondary | ICD-10-CM

## 2017-01-15 DIAGNOSIS — M6281 Muscle weakness (generalized): Secondary | ICD-10-CM

## 2017-01-15 NOTE — Patient Instructions (Addendum)
  Coordination Activities  Perform the following activities for 20 minutes 2 times per day with right hand(s).   Rotate ball in fingertips (clockwise and counter-clockwise).  Toss ball between hands.  Toss ball in air and catch with the same hand.  Juggle 2 balls.  Flip cards 1 at a time as fast as you can.  Deal cards with your thumb (Hold deck in hand and push card off top with thumb).  Pick up coins and place in container or coin bank.  Pick up coins and stack.- WAIT TO DO THIS  Pick up coins one at a time until you get 5-10 in your hand, then move coins from palm to fingertips to stack one at a time. WAIT TO DO THIS  Practice writing and/or typing.  Screw together nuts and bolts, then unfasten. Use larger nuts and bolts for this right now.  For writing use pen with pen grip when practicing instead of the brown foam. Practice typing 15 minutes 3 times per day Practice writing several times per day

## 2017-01-15 NOTE — Therapy (Signed)
[facility] [address] [place], [place], [address] Phone: [phone]   Fax:  [phone]  Occupational Therapy Treatment  Patient Details  Name: Manuel Boyer MRN: [id number] Date of Birth: [birth year] Referring Provider: Dr. Carlynn Purl  Encounter Date: 01/15/2017      OT End of Session - 01/15/17 1645    Visit Number 10   Number of Visits 16   Date for OT Re-Evaluation 02/07/17   Authorization Type [employer] 35 visits for OT   Authorization - Visit Number 10   Authorization - Number of Visits 35   OT Start Time 1401   OT Stop Time 1444   OT Time Calculation (min) 43 min   Activity Tolerance Patient tolerated treatment well      Past Medical History:  Diagnosis Date  . Internal carotid artery dissection (HCC)   . Moya moya disease   . Stroke [facility])     Past Surgical History:  Procedure Laterality Date  . IR ANGIO INTRA EXTRACRAN SEL COM CAROTID INNOMINATE BILAT MOD SED  11/28/2016  . IR ANGIO VERTEBRAL SEL VERTEBRAL BILAT MOD SED  11/28/2016  . RADIOLOGY WITH ANESTHESIA N/A 11/28/2016   Procedure: RADIOLOGY WITH ANESTHESIA;  Surgeon: Julieanne Cotton, MD;  Location: [facility] OR;  Service: Radiology;  Laterality: N/A;    There were no vitals filed for this visit.      Subjective Assessment - 01/15/17 1404    Subjective  I went swimming this weekend.    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 No/denies                      OT Treatments/Exercises (OP) - 01/15/17 0001      ADLs   Writing Addressed writing with pen grip only - pt able to write at 3-4 sentence paragraph level with 90% legibility - rated fatgiue of 3/10 at end of activity. Also practice typing 3 sentence paragraph. Pt  required increased time and self corrected several errors but able to complete task using both hands.  Pt reports he is practicing about 30 minutes a day at home - recommended he do 15 minutes 3 times per day and pt verbalized understanding. Expanded coordination program for home - see pt instruction for details. Pt able to verbalize understanding and return demonstrate after practice. Also advanced pt to red putty.                OT Education - 01/15/17 1642    Education provided Yes   Education Details expanded HEP for coordination and upgraded putty to red putty    Person(s) Educated Patient   Methods Explanation;Demonstration;Verbal cues;Handout   Comprehension Verbalized understanding;Returned demonstration          OT Short Term Goals - 01/15/17 1642      OT SHORT TERM GOAL #1   Title Pt will be mod I with HEP- 01/10/2017   Status Achieved     OT SHORT TERM GOAL #2   Title Pt will be mod I with bathing   Status Achieved     OT SHORT TERM GOAL #3   Title Pt will be mod I with shower transfers   Status Achieved  OT SHORT TERM GOAL #4   Title Pt will be mod I using electric razor   Status Achieved     OT SHORT TERM GOAL #5   Title Pt will feed self at least 50% of meals with RUE with AE prn   Status Achieved     OT SHORT TERM GOAL #6   Title Pt will be able to write first name legibly using AE prn   Status Achieved     OT SHORT TERM GOAL #7   Title Pt will use RUE as gross assist at least 25% of the time  for simple familiar hot meal prep and require no more than supervision    Status Achieved           OT Long Term Goals - 01/15/17 1643      OT LONG TERM GOAL #1   Title Pt will be mod I with upgraded HEP- 02/07/2017   Status On-going     OT LONG TERM GOAL #2   Title Pt will eat 100% of meals with RUE with AE prn   Status Achieved     OT LONG TERM GOAL #3   Title Pt will use RUE as dominant at least 75% of the time for home mgmt tasks.    Status  On-going     OT LONG TERM GOAL #4   Title Pt will be able to play simple song on drums using both hands   Status On-going     OT LONG TERM GOAL #5   Title Pt and family will verbalize understanding of driving eval recommendations.    Status Achieved     OT LONG TERM GOAL #6   Title Pt will demonstrate ability to write 3-4 sentence paragraph with 100% legiblity using pen grip only   Status On-going     OT LONG TERM GOAL #7   Title Pt will be able to type short email with no more than 3 errors.    Status On-going     OT LONG TERM GOAL #8   Title Pt will demonstate at least 18  pounds of grip strength to assist with functional tasks   Status On-going               Plan - 01/15/17 1643    Clinical Impression Statement Pt continues to progress toward goals. Pt encouraged to attempt simple song on drums at home this week.    Rehab Potential Good   Current Impairments/barriers affecting progress: aphasia and apraxia   OT Frequency 2x / week   OT Duration 8 weeks   OT Treatment/Interventions Self-care/ADL training;Electrical Stimulation;Therapeutic exercise;Neuromuscular education;DME and/or AE instruction;Passive range of motion;Manual Therapy;Splinting;Therapeutic activities;Patient/family education   Plan functional use of RUE, pinch and grip strength via functional tasks, manipulation of items in hand, isolated control, hand orientation.    Consulted and Agree with Plan of Care Patient      Patient will benefit from skilled therapeutic intervention in order to improve the following deficits and impairments:  Decreased cognition, Decreased coordination, Decreased range of motion, Decreased knowledge of use of DME, Decreased strength, Impaired UE functional use, Other (comment)  Visit Diagnosis: Apraxia  Hemiplegia and hemiparesis following cerebral infarction affecting right dominant side (HCC)  Muscle weakness (generalized)  Other lack of coordination  Other symptoms  and signs involving cognitive functions following cerebral infarction    Problem List Patient Active Problem List   Diagnosis Date Noted  . Internal carotid artery dissection (HCC)  12/10/2016  . Moya moya disease 12/10/2016  . History of tobacco use 12/10/2016  . Prediabetes 12/06/2016  . Hyperlipidemia 12/06/2016  . [facility] ([facility]re[facility]) [facility](HCC) 11/28/2016    Norton PastelPulaski, Marlean Mortell Halliday, OTR/L 01/15/2017, 4:47 PM  [facility] [address] [place], [place], [address] Phone: [phone]   Fax:  [phone]  Name: Manuel Boyer MRN: [id number] Date of Birth: Aug 11, 1990

## 2017-01-15 NOTE — Therapy (Signed)
[facility] [address] [place], [place], [address] Phone: [phone]   Fax:  [phone]  Speech Language Pathology Treatment  Patient Details  Name: Manuel Boyer MRN: [id number] Date of Birth: 10-12-1990 Referring Provider: Gust RungHoffman, Erik C, DO  Encounter Date: 01/15/2017      End of Session - 01/15/17 1758    Visit Number 11   Number of Visits 24   Date for SLP Re-Evaluation 02/09/17   SLP Start Time 1317   SLP Stop Time  1400   SLP Time Calculation (min) 43 min   Activity Tolerance Patient tolerated treatment well      Past Medical History:  Diagnosis Date  . Internal carotid artery dissection (HCC)   . Moya moya disease   . Stroke [facility](HCC)     Past Surgical History:  Procedure Laterality Date  . IR ANGIO INTRA EXTRACRAN SEL COM CAROTID INNOMINATE BILAT MOD SED  11/28/2016  . IR ANGIO VERTEBRAL SEL VERTEBRAL BILAT MOD SED  11/28/2016  . RADIOLOGY WITH ANESTHESIA N/A 11/28/2016   Procedure: RADIOLOGY WITH ANESTHESIA;  Surgeon: Julieanne Cottoneveshwar, Sanjeev, MD;  [facility] OR;  Service: Radiology;  Laterality: N/A;    There were no vitals filed for this visit.      Subjective Assessment - 01/15/17 1318    Subjective "we walked around the building just talking about the.... fan fest and stuff." re last ST session   Currently in Pain? No/denies               ADULT SLP TREATMENT - 01/15/17 1317      General Information   Behavior/Cognition Alert;Cooperative;Pleasant mood   Patient Positioning Upright in chair   Oral care provided N/A     Treatment Provided   Treatment provided Cognitive-Linquistic     Pain Assessment   Pain Assessment No/denies pain     Cognitive-Linquistic Treatment   Treatment focused on Aphasia   Skilled Treatment SLP facilitated 15 minute mod-complex conversation during which pt participated functionally with rare ? cues from SLP for clarification. Pt described reading to  his son more fluently, though he states he fatigues and occasionally asks his wife to take over. Reports he struggles to remember what he reads, has begun taking notes when he reads his Bible. SLP provided multiparagraph level reading task; pt required occasional mod A for comprehension and generated simple sentences responses independently.     Assessment / Recommendations / Plan   Plan Continue with current plan of care     Progression Toward Goals   Progression toward goals Progressing toward goals          SLP Education - 01/15/17 1757    Education provided Yes   Education Details slower rate when reading, check for comprehension errors   Person(s) Educated Patient   Methods Explanation;Verbal cues   Comprehension Verbalized understanding;Returned demonstration          SLP Short Term Goals - 01/15/17 1332      SLP SHORT TERM GOAL #1   Title Pt will name 8 items for simple categories with occasional min A.   Status Achieved     SLP SHORT TERM GOAL #2   Title pt will demo understanding of 10 minutes of simple-mod complex conversation over two sessions   Status Achieved     SLP SHORT TERM GOAL #3   Title pt will generate simple sentences responding to therapy stimuli with of average 5 words over two sessions   Status  Achieved     SLP SHORT TERM GOAL #4   Title Pt will participate in assessment of cognitive communication within first 4 sessions.   Status Achieved          SLP Long Term Goals - 01/15/17 1332      SLP LONG TERM GOAL #1   Title Pt will utilize compensations for aphasia 80% of opportunities in simple conversation with occasional min A   Status Achieved     SLP LONG TERM GOAL #2   Title pt will participate functionally in a 15 minute mod complex conversation in 2 sessions   Baseline 01-12-17   Time 4   Period Weeks   Status On-going     SLP LONG TERM GOAL #3   Title Pt will demo understanding of 15 min simple to mod complex conversation with  repeats allowed x2.   Status Achieved     SLP LONG TERM GOAL #4   Title pt will tell SLP of using a memory strategy in home environment x5 times (when asked)   Time 4   Period Weeks   Status On-going     SLP LONG TERM GOAL #5   Title pt will demo [facility] speech/language in a mod complex/complex conversation of 20 minutes   Time 4   Period Weeks   Status On-going          Plan - 01/15/17 1758    Clinical Impression Statement Pt continues with high motivation for positive change in his speech. Pt's success with speech and language fluidity in conversation is improving. In multiparagraph reading task today, pt required occasional mod A for comprehension. Continue ST intervention is recommended to facilitate effective communication, improve quality of life and increase likelihood of return to work.   Speech Therapy Frequency 3x / week   Treatment/Interventions Cueing hierarchy;Functional tasks;Patient/family education;Multimodal communcation approach;Cognitive reorganization;Language facilitation;Internal/external aids;SLP instruction and feedback;Compensatory techniques   Potential to Achieve Goals Good   Potential Considerations Family/community support;Ability to learn/carryover information;Previous level of function;Cooperation/participation level   SLP Home Exercise Plan reviewed and provided   Consulted and Agree with Plan of Care Patient;Family member/caregiver   Family Member Consulted daughter      Patient will benefit from skilled therapeutic intervention in order to improve the following deficits and impairments:   Aphasia  Cognitive communication deficit    Problem List Patient Active Problem List   Diagnosis Date Noted  . Internal carotid artery dissection (HCC) 12/10/2016  . Moya moya disease 12/10/2016  . History of tobacco use 12/10/2016  . Prediabetes 12/06/2016  . Hyperlipidemia 12/06/2016  . CVA (cerebral vascular accident) (HCC) 11/28/2016   [name], MS, CCC-SLP Speech-Language Pathologist  [name] 01/15/2017, 6:00 PM   [facility] [address] [place], [place], [address] Phone: [phone]   Fax:  [phone]   Name: Manuel Boyer MRN: [id number] Date of Birth: 1991-05-17

## 2017-01-17 ENCOUNTER — Ambulatory Visit: Payer: BLUE CROSS/BLUE SHIELD

## 2017-01-17 ENCOUNTER — Encounter: Payer: Self-pay | Admitting: Occupational Therapy

## 2017-01-17 ENCOUNTER — Ambulatory Visit: Payer: BLUE CROSS/BLUE SHIELD | Admitting: Occupational Therapy

## 2017-01-17 ENCOUNTER — Ambulatory Visit: Payer: BLUE CROSS/BLUE SHIELD | Admitting: Neurology

## 2017-01-17 DIAGNOSIS — M6281 Muscle weakness (generalized): Secondary | ICD-10-CM

## 2017-01-17 DIAGNOSIS — I69351 Hemiplegia and hemiparesis following cerebral infarction affecting right dominant side: Secondary | ICD-10-CM

## 2017-01-17 DIAGNOSIS — R4701 Aphasia: Secondary | ICD-10-CM

## 2017-01-17 DIAGNOSIS — R482 Apraxia: Secondary | ICD-10-CM

## 2017-01-17 DIAGNOSIS — R278 Other lack of coordination: Secondary | ICD-10-CM

## 2017-01-17 DIAGNOSIS — R41841 Cognitive communication deficit: Secondary | ICD-10-CM

## 2017-01-17 NOTE — Therapy (Signed)
[facility] [address] [place], [place], [address] Phone: [phone]   Fax:  [phone]  Occupational Therapy Treatment  Patient Details  Name: Manuel Boyer MRN: [id number] Date of Birth: September 21, 1990 Referring Provider: Dr. Carlynn Purl  Encounter Date: 01/17/2017      OT End of Session - 01/17/17 1557    Visit Number 11   Number of Visits 16   Date for OT Re-Evaluation 02/07/17   Authorization Type [employer] 35 visits for OT   Authorization - Visit Number 11   Authorization - Number of Visits 35   OT Start Time 1448   OT Stop Time 1530   OT Time Calculation (min) 42 min   Activity Tolerance Patient tolerated treatment well   Behavior During Therapy [facility] for tasks assessed/performed      Past Medical History:  Diagnosis Date  . Internal carotid artery dissection (HCC)   . Moya moya disease   . Stroke [facility])     Past Surgical History:  Procedure Laterality Date  . IR ANGIO INTRA EXTRACRAN SEL COM CAROTID INNOMINATE BILAT MOD SED  11/28/2016  . IR ANGIO VERTEBRAL SEL VERTEBRAL BILAT MOD SED  11/28/2016  . RADIOLOGY WITH ANESTHESIA N/A 11/28/2016   Procedure: RADIOLOGY WITH ANESTHESIA;  Surgeon: Julieanne Cotton, MD;  Location: [facility] OR;  Service: Radiology;  Laterality: N/A;    There were no vitals filed for this visit.      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 No/denies   Pain Score 0-No pain                      OT Treatments/Exercises (OP) - 01/17/17 0001      Neurological Re-education Exercises   Other Exercises 1 Patient with improved active movement in right index  and thumb - however has difficulty activating as warranted for function, e.g. in hand manipulation, pinch, lateral pinch.  Worked with patient for more controlled use of right hand functionally for cutting, tearing tape from dispenser, folding paper, crumbling paper into ball.  Patient's movement slow, but increasingly greater movement in thumb and index finger.  During in hand manipulation working to have patient utilize gravity to help move objects to tips of fingers, to maintain cupped hand shape to not lose items ulnarly.     Other Exercises 2 Reviewed upgrade HEP.  Patient reports he is now spending 15 min 3x/day as instructed.                  OT Education - 01/17/17 1556    Education provided Yes   Education Details in hand manipulation, shaping of hand to task   Person(s) Educated Patient;Child(ren)   Methods Explanation;Demonstration   Comprehension Verbalized understanding;Returned demonstration;Need further instruction          OT Short Term Goals - 01/17/17 1558      OT SHORT TERM GOAL #1   Title Pt will be mod I with HEP- 01/10/2017   Status Achieved     OT SHORT TERM GOAL #2   Title Pt will be mod I with bathing  Status Achieved     OT SHORT TERM GOAL #3   Title Pt will be mod I with shower transfers   Status Achieved     OT SHORT TERM GOAL #4   Title Pt will be mod I using electric razor   Status Achieved     OT SHORT TERM GOAL #5   Title Pt will feed self at least 50% of meals with RUE with AE prn   Status Achieved     OT SHORT TERM GOAL #6   Title Pt will be able to write first name legibly using AE prn   Status Achieved     OT SHORT TERM GOAL #7   Title Pt will use RUE as gross assist at least 25% of the time  for simple familiar hot meal prep and require no more than supervision    Status Achieved           OT Long Term Goals - 01/17/17 1559      OT LONG TERM GOAL #1   Title Pt will be mod I with upgraded HEP- 02/07/2017   Status On-going      OT LONG TERM GOAL #2   Title Pt will eat 100% of meals with RUE with AE prn   Status Achieved     OT LONG TERM GOAL #3   Title Pt will use RUE as dominant at least 75% of the time for home mgmt tasks.    Status On-going     OT LONG TERM GOAL #4   Title Pt will be able to play simple song on drums using both hands   Status On-going     OT LONG TERM GOAL #5   Title Pt and family will verbalize understanding of driving eval recommendations.    Status Achieved     OT LONG TERM GOAL #6   Title Pt will demonstrate ability to write 3-4 sentence paragraph with 100% legiblity using pen grip only   Status On-going     OT LONG TERM GOAL #7   Title Pt will be able to type short email with no more than 3 errors.    Status On-going     OT LONG TERM GOAL #8   Title Pt will demonstate at least 18  pounds of grip strength to assist with functional tasks   Status On-going               Plan - 01/17/17 1557    Clinical Impression Statement Patient continues to show steady progress in functional use of right hand.     Rehab Potential Good   Current Impairments/barriers affecting progress: aphasia and apraxia   OT Frequency 2x / week   OT Duration 8 weeks   OT Treatment/Interventions Self-care/ADL training;Electrical Stimulation;Therapeutic exercise;Neuromuscular education;DME and/or AE instruction;Passive range of motion;Manual Therapy;Splinting;Therapeutic activities;Patient/family education   Consulted and Agree with Plan of Care Patient      Patient will benefit from skilled therapeutic intervention in order to improve the following deficits and impairments:  Decreased cognition, Decreased coordination, Decreased range of motion, Decreased knowledge of use of DME, Decreased strength, Impaired UE functional use, Other (comment)  Visit Diagnosis: Apraxia  Hemiplegia and hemiparesis following cerebral infarction affecting right dominant side (HCC)  Muscle weakness  (generalized)  Other lack of coordination    Problem List Patient Active Problem List   Diagnosis Date Noted  . Internal carotid artery dissection (HCC) 12/10/2016  . Moya moya disease 12/10/2016  . History  of tobacco use 12/10/2016  . Prediabetes 12/06/2016  . Hyperlipidemia 12/06/2016  . CVA (cerebral vascular accident) (HCC) 11/28/2016    Collier SalinaGellert, Chirsty Armistead M, OTR/L 01/17/2017, 4:00 PM  [facility] [address] [place], [place], [address] Phone: [phone]   Fax:  [phone]  Name: Noe GensJamar Boyer MRN: [id number] Date of Birth: [birth year]

## 2017-01-17 NOTE — Therapy (Signed)
[facility] [address] [place], [place], [address] Phone: [phone]   Fax:  [phone]  Speech Language Pathology Treatment  Patient Details  Name: Manuel Boyer MRN: [id number] Date of Birth: 06/04/1991 Referring Provider: Gust Rung, DO  Encounter Date: 01/17/2017      End of Session - 01/17/17 1657    Visit Number 12   Number of Visits 24   Date for SLP Re-Evaluation 02/09/17   SLP Start Time 1534   SLP Stop Time  1618   SLP Time Calculation (min) 44 min   Activity Tolerance Patient tolerated treatment well      Past Medical History:  Diagnosis Date  . Internal carotid artery dissection (HCC)   . Moya moya disease   . Stroke [facility])     Past Surgical History:  Procedure Laterality Date  . IR ANGIO INTRA EXTRACRAN SEL COM CAROTID INNOMINATE BILAT MOD SED  11/28/2016  . IR ANGIO VERTEBRAL SEL VERTEBRAL BILAT MOD SED  11/28/2016  . RADIOLOGY WITH ANESTHESIA N/A 11/28/2016   Procedure: RADIOLOGY WITH ANESTHESIA;  Surgeon: Julieanne Cotton, MD;  Location: [facility] OR;  Service: Radiology;  Laterality: N/A;    There were no vitals filed for this visit.             ADULT SLP TREATMENT - 01/17/17 1606      General Information   Behavior/Cognition Alert;Cooperative;Pleasant mood     Cognitive-Linquistic Treatment   Treatment focused on Aphasia   Skilled Treatment Pt spoke out doors with excellent use of compensations. SLP reviewed pt's LTGs and he stated he would like to add one for reading comprehension.Marland Kitchen SLP updated goals. Inside, pt took notes on one selection with 100% accuracy on retell, and one selection without notes with approx 80% accuracy with retell. SLP highlighted to pt that taking notes helps pt for comprehension and recall. Part of conversation outside revolved around pt's use of memory strategies, which pt admits are intermittent - he forgot a check at home which needed depositing. SLP  re-educated pt re: sticky notes,  and other compensations.      Assessment / Recommendations / Plan   Plan Goals updated     Progression Toward Goals   Progression toward goals Progressing toward goals          SLP Education - 01/17/17 1656    Education provided Yes   Education Details note taking assists recall and comprehension in reading tasks   Person(s) Educated Patient;Child(ren)   Methods Explanation   Comprehension Verbalized understanding          SLP Short Term Goals - 01/17/17 1659      SLP SHORT TERM GOAL #1   Title Pt will name 8 items for simple categories with occasional min A.   Status Achieved     SLP SHORT TERM GOAL #2   Title pt will demo understanding of 10 minutes of simple-mod complex conversation over two sessions   Status Achieved     SLP SHORT TERM GOAL #3   Title pt will generate simple sentences responding to therapy stimuli with of average 5 words over two sessions   Status Achieved     SLP SHORT TERM GOAL #4   Title Pt will participate in assessment of cognitive communication within first 4 sessions.   Status Achieved          SLP Long Term Goals - 01/17/17 1552      SLP LONG TERM GOAL #1  Title Pt will utilize compensations for aphasia 80% of opportunities in simple conversation with occasional min A   Status Achieved     SLP LONG TERM GOAL #2   Title pt will participate functionally in a 15 minute mod complex conversation in 2 sessions   Baseline --   Time --   Period --   Status Achieved     SLP LONG TERM GOAL #3   Title Pt will demo understanding of 15 min simple to mod complex conversation with repeats allowed x2.   Status Achieved     SLP LONG TERM GOAL #4   Title pt will tell SLP of using a memory strategy in home environment x5 times (when asked)   Time 4   Period Weeks   Status On-going     SLP LONG TERM GOAL #5   Title pt will demo [facility] speech/language in a mod complex/complex conversation of 20 minutes    Time 4   Period Weeks   Status On-going     Additional Long Term Goals   Additional Long Term Goals Yes     SLP LONG TERM GOAL #6   Title pt will demo understanding of a passage of 4-5 paragraphs using compensatory strategies (re-read, notes, etc)   Time 4   Period Weeks   Status New          Plan - 01/17/17 1657    Clinical Impression Statement Pt continues with high motivation for positive change in his speech. Pt's success with speech and language fluidity in conversation continues as good-excellent, given compensations. In a reading task today, pt required to take notes for improved comprehension. SLP updated goals to include reading comprehension goal. Continue ST intervention is recommended to facilitate effective communication, improve quality of life and increase likelihood of return to work.   Speech Therapy Frequency 3x / week   Treatment/Interventions Cueing hierarchy;Functional tasks;Patient/family education;Multimodal communcation approach;Cognitive reorganization;Language facilitation;Internal/external aids;SLP instruction and feedback;Compensatory techniques   Potential to Achieve Goals Good   Potential Considerations Family/community support;Ability to learn/carryover information;Previous level of function;Cooperation/participation level   SLP Home Exercise Plan reviewed and provided   Consulted and Agree with Plan of Care Patient;Family member/caregiver   Family Member Consulted daughter      Patient will benefit from skilled therapeutic intervention in order to improve the following deficits and impairments:   Aphasia  Cognitive communication deficit    Problem List Patient Active Problem List   Diagnosis Date Noted  . Internal carotid artery dissection (HCC) 12/10/2016  . Moya moya disease 12/10/2016  . History of tobacco use 12/10/2016  . Prediabetes 12/06/2016  . Hyperlipidemia 12/06/2016  . CVA (cerebral vascular accident) (HCC) 11/28/2016     [facility] HospitalCHINKE,Zsazsa Bahena ,MS, CCC-SLP  01/17/2017, 5:01 PM  [facility] [address] [place], [place], [address] Phone: [phone]   Fax:  [phone]   Name: Manuel Boyer MRN: [id number] Date of Birth: 02-03-1991

## 2017-01-19 ENCOUNTER — Encounter: Payer: BLUE CROSS/BLUE SHIELD | Admitting: Occupational Therapy

## 2017-01-22 ENCOUNTER — Encounter: Payer: Self-pay | Admitting: Occupational Therapy

## 2017-01-22 ENCOUNTER — Ambulatory Visit: Payer: BLUE CROSS/BLUE SHIELD | Admitting: Speech Pathology

## 2017-01-22 ENCOUNTER — Ambulatory Visit: Payer: BLUE CROSS/BLUE SHIELD | Admitting: Occupational Therapy

## 2017-01-22 DIAGNOSIS — I69351 Hemiplegia and hemiparesis following cerebral infarction affecting right dominant side: Secondary | ICD-10-CM | POA: Diagnosis not present

## 2017-01-22 DIAGNOSIS — R278 Other lack of coordination: Secondary | ICD-10-CM

## 2017-01-22 DIAGNOSIS — M6281 Muscle weakness (generalized): Secondary | ICD-10-CM

## 2017-01-22 DIAGNOSIS — R4701 Aphasia: Secondary | ICD-10-CM

## 2017-01-22 DIAGNOSIS — I69318 Other symptoms and signs involving cognitive functions following cerebral infarction: Secondary | ICD-10-CM

## 2017-01-22 DIAGNOSIS — R482 Apraxia: Secondary | ICD-10-CM

## 2017-01-22 NOTE — Therapy (Signed)
[facility] [address] [place], [place], [address] Phone: [phone]   Fax:  [phone]  Speech Language Pathology Treatment  Patient Details  Name: Manuel Boyer MRN: [id number] Date of Birth: 04-06-1991 Referring Provider: Gust Rung, DO  Encounter Date: 01/22/2017      End of Session - 01/22/17 1019    Visit Number 13   Number of Visits 24   Date for SLP Re-Evaluation 02/09/17   SLP Start Time 1016   SLP Stop Time  1100   SLP Time Calculation (min) 44 min   Activity Tolerance Patient tolerated treatment well      Past Medical History:  Diagnosis Date  . Internal carotid artery dissection (HCC)   . Moya moya disease   . Stroke [facility])     Past Surgical History:  Procedure Laterality Date  . IR ANGIO INTRA EXTRACRAN SEL COM CAROTID INNOMINATE BILAT MOD SED  11/28/2016  . IR ANGIO VERTEBRAL SEL VERTEBRAL BILAT MOD SED  11/28/2016  . RADIOLOGY WITH ANESTHESIA N/A 11/28/2016   Procedure: RADIOLOGY WITH ANESTHESIA;  Surgeon: Julieanne Cotton, MD;  Location: [facility] OR;  Service: Radiology;  Laterality: N/A;    There were no vitals filed for this visit.      Subjective Assessment - 01/22/17 1018    Subjective "I left my notebook at home."   Currently in Pain? No/denies               ADULT SLP TREATMENT - 01/22/17 1018      General Information   Behavior/Cognition Alert;Cooperative;Pleasant mood   Patient Positioning Upright in chair   Oral care provided N/A     Treatment Provided   Treatment provided Cognitive-Linquistic     Cognitive-Linquistic Treatment   Treatment focused on Aphasia   Skilled Treatment SLP facilitated 20 minutes complex conversation re: pt's appointment with [facility] neurology, moya-moya, and potential [facility] bypass. Pt initially req'd mod cues from SLP fading to rare min A to verbalize specific details of his appointment, research he has been doing about moya moya/ [facility].  Targeted multi-paragraph reading of article with occasional min A to use compensations/notetaking; pt demo'd understanding by responding accurately to 80% of questions re: content by SLP.     Assessment / Recommendations / Plan   Plan Continue with current plan of care     Progression Toward Goals   Progression toward goals Progressing toward goals          SLP Education - 01/22/17 1136    Education provided Yes   Education Details note taking for recall/ comprehension   Person(s) Educated Patient;Child(ren)   Methods Explanation;Verbal cues   Comprehension Verbalized understanding;Returned demonstration          SLP Short Term Goals - 01/22/17 1138      SLP SHORT TERM GOAL #1   Title Pt will name 8 items for simple categories with occasional min A.   Status Achieved     SLP SHORT TERM GOAL #2   Title pt will demo understanding of 10 minutes of simple-mod complex conversation over two sessions   Status Achieved     SLP SHORT TERM GOAL #3   Title pt will generate simple sentences responding to therapy stimuli with of average 5 words over two sessions   Status Achieved     SLP SHORT TERM GOAL #4   Title Pt will participate in assessment of cognitive communication within first 4 sessions.   Status Achieved  SLP Long Term Goals - 01/22/17 1132      SLP LONG TERM GOAL #1   Title Pt will utilize compensations for aphasia 80% of opportunities in simple conversation with occasional min A   Status Achieved     SLP LONG TERM GOAL #2   Title pt will participate functionally in a 15 minute mod complex conversation in 2 sessions   Status Achieved     SLP LONG TERM GOAL #3   Title Pt will demo understanding of 15 min simple to mod complex conversation with repeats allowed x2.   Status Achieved     SLP LONG TERM GOAL #4   Title pt will tell SLP of using a memory strategy in home environment x5 times (when asked)   Time 3   Period Weeks   Status On-going      SLP LONG TERM GOAL #5   Title pt will demo [facility] speech/language in a mod complex/complex conversation of 20 minutes   Time 3   Period Weeks   Status On-going     SLP LONG TERM GOAL #6   Title pt will demo understanding of a passage of 4-5 paragraphs using compensatory strategies (re-read, notes, etc)   Time 3   Period Weeks   Status On-going          Plan - 01/22/17 1137    Clinical Impression Statement Pt continues with high motivation for positive change in his speech. Pt's success with speech and language fluidity in conversation continues as good-excellent, given compensations. In a reading task today, pt required to take notes for improved comprehension. Continue ST intervention is recommended to facilitate effective communication, improve quality of life and increase likelihood of return to work.   Speech Therapy Frequency 3x / week   Treatment/Interventions Cueing hierarchy;Functional tasks;Patient/family education;Multimodal communcation approach;Cognitive reorganization;Language facilitation;Internal/external aids;SLP instruction and feedback;Compensatory techniques   Potential to Achieve Goals Good   Potential Considerations Family/community support;Ability to learn/carryover information;Previous level of function;Cooperation/participation level   SLP Home Exercise Plan reviewed and provided   Consulted and Agree with Plan of Care Patient;Family member/caregiver   Family Member Consulted daughter      Patient will benefit from skilled therapeutic intervention in order to improve the following deficits and impairments:   Aphasia    Problem List Patient Active Problem List   Diagnosis Date Noted  . Internal carotid artery dissection (HCC) 12/10/2016  . Moya moya disease 12/10/2016  . History of tobacco use 12/10/2016  . Prediabetes 12/06/2016  . Hyperlipidemia 12/06/2016  . CVA (cerebral vascular accident) (HCC) 11/28/2016   [name], MS,  CCC-SLP Speech-Language Pathologist  [name] 01/22/2017, 11:38 AM  [facility] [address] [place], [place], [address] Phone: [phone]   Fax:  [phone]   Name: Randol Bermel MRN: [id number] Date of Birth: May 15, 1991

## 2017-01-22 NOTE — Therapy (Signed)
[facility] [address] [place], [place], [address] Phone: [phone]   Fax:  [phone]  Occupational Therapy Treatment  Patient Details  Name: Manuel Boyer MRN: [id number] Date of Birth: 08/23/1990 Referring Provider: Dr. Carlynn Purl  Encounter Date: 01/22/2017      OT End of Session - 01/22/17 1016    Visit Number 12   Number of Visits 16   Date for OT Re-Evaluation 02/07/17   Authorization Type [employer] 35 visits for OT   Authorization - Visit Number 12   Authorization - Number of Visits 35   OT Start Time 0931   OT Stop Time 1015   OT Time Calculation (min) 44 min   Activity Tolerance Patient tolerated treatment well      Past Medical History:  Diagnosis Date  . Internal carotid artery dissection (HCC)   . Moya moya disease   . Stroke [facility])     Past Surgical History:  Procedure Laterality Date  . IR ANGIO INTRA EXTRACRAN SEL COM CAROTID INNOMINATE BILAT MOD SED  11/28/2016  . IR ANGIO VERTEBRAL SEL VERTEBRAL BILAT MOD SED  11/28/2016  . RADIOLOGY WITH ANESTHESIA N/A 11/28/2016   Procedure: RADIOLOGY WITH ANESTHESIA;  Surgeon: Julieanne Cotton, MD;  Location: [facility] OR;  Service: Radiology;  Laterality: N/A;    There were no vitals filed for this visit.      Subjective Assessment - 01/22/17 0933    Subjective  I went to family reunion in [place] yesterday - it was great.   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 No/denies                      OT Treatments/Exercises (OP) - 01/22/17 0001      ADLs   Writing Addressed writing using small pen grip on regular pen. Pt reports he has been practicing several times a day at home and this is becoming easier.  Pt today abe to copy  full paragraph (4 sentences) with 100% legibility with printing. Pt printed prior to stroke.    Therapeutic activities. Addressed lateral and 2/3 point pinch as well as grip by having pt staple papers, remove staples with staple remover, and putting paper clips on papers. Pt with mderate difficulty. Performance greatly improved when pt was cued to look at hand and not paper. Performance also imrproved with practice and repetition. Pt also for first time able to use gripper on #1 and even able to pick kup 5 blocks with gripper on #2 to address grip strength.                   OT Short Term Goals - 01/22/17 1014      OT SHORT TERM GOAL #1   Title Pt will be mod I with HEP- 01/10/2017   Status Achieved     OT SHORT TERM GOAL #2   Title Pt will be mod I with bathing   Status Achieved     OT SHORT TERM GOAL #3   Title Pt will be mod I with shower transfers   Status Achieved     OT SHORT TERM GOAL #4   Title Pt will be mod I using Neurosurgeon  Status Achieved     OT SHORT TERM GOAL #5   Title Pt will feed self at least 50% of meals with RUE with AE prn   Status Achieved     OT SHORT TERM GOAL #6   Title Pt will be able to write first name legibly using AE prn   Status Achieved     OT SHORT TERM GOAL #7   Title Pt will use RUE as gross assist at least 25% of the time  for simple familiar hot meal prep and require no more than supervision    Status Achieved           OT Long Term Goals - 01/22/17 1014      OT LONG TERM GOAL #1   Title Pt will be mod I with upgraded HEP- 02/07/2017   Status On-going     OT LONG TERM GOAL #2   Title Pt will eat 100% of meals with RUE with AE prn   Status Achieved     OT LONG TERM GOAL #3   Title Pt will use RUE as dominant at least 75% of the time for home mgmt tasks.    Status On-going     OT LONG TERM GOAL #4   Title Pt will be able to play simple song on drums using both hands   Status On-going     OT LONG TERM GOAL #5    Title Pt and family will verbalize understanding of driving eval recommendations.    Status Achieved     OT LONG TERM GOAL #6   Title Pt will demonstrate ability to write 3-4 sentence paragraph with 100% legiblity using pen grip only   Status Achieved     OT LONG TERM GOAL #7   Title Pt will be able to type short email with no more than 3 errors.    Status On-going     OT LONG TERM GOAL #8   Title Pt will demonstate at least 18  pounds of grip strength to assist with functional tasks   Status On-going               Plan - 01/22/17 1014    Clinical Impression Statement Pt with slow but steady progress toward goals. Pt with improved handwriting.   Rehab Potential Good   Current Impairments/barriers affecting progress: aphasia and apraxia   OT Frequency 2x / week   OT Duration 8 weeks   OT Treatment/Interventions Self-care/ADL training;Electrical Stimulation;Therapeutic exercise;Neuromuscular education;DME and/or AE instruction;Passive range of motion;Manual Therapy;Splinting;Therapeutic activities;Patient/family education   Plan functional use of RUE especially for grip and pinch, manipulation of larger items in hand, isolated control, hand orientation. Cue pt to look at hand.    Consulted and Agree with Plan of Care Patient      Patient will benefit from skilled therapeutic intervention in order to improve the following deficits and impairments:  Decreased cognition, Decreased coordination, Decreased range of motion, Decreased knowledge of use of DME, Decreased strength, Impaired UE functional use, Other (comment)  Visit Diagnosis: Apraxia  Hemiplegia and hemiparesis following cerebral infarction affecting right dominant side (HCC)  Muscle weakness (generalized)  Other lack of coordination  Other symptoms and signs involving cognitive functions following cerebral infarction    Problem List Patient Active Problem List   Diagnosis Date Noted  . Internal carotid  artery dissection (HCC) 12/10/2016  . Moya moya disease 12/10/2016  . History of tobacco use 12/10/2016  . Prediabetes 12/06/2016  .  Hyperlipidemia 12/06/2016  . CVA (cerebral vascular accident) (HCC) 11/28/2016    Norton Pastel, OTR/L 01/22/2017, 10:18 AM  [facility] [address] [place], [place], [address] Phone: [phone]   Fax:  [phone]  Name: Manuel Boyer MRN: [id number] Date of Birth: [birth year]

## 2017-01-23 ENCOUNTER — Encounter: Payer: Self-pay | Admitting: Internal Medicine

## 2017-01-23 ENCOUNTER — Ambulatory Visit (INDEPENDENT_AMBULATORY_CARE_PROVIDER_SITE_OTHER): Payer: BLUE CROSS/BLUE SHIELD | Admitting: Internal Medicine

## 2017-01-23 DIAGNOSIS — I675 Moyamoya disease: Secondary | ICD-10-CM

## 2017-01-23 NOTE — Progress Notes (Signed)
   CC: Follow up of moya moya   HPI:  [name] is a 26 y.o. with PMH as listed below who presents for follow up of CVA. Please see the assessment and plans for the status of the patient chronic medical problems.   Past Medical History:  Diagnosis Date  . Internal carotid artery dissection (HCC)   . Moya moya disease   . Stroke [facility])    Review of Systems:  Refer to history of present illness and assessment and plans for pertinent review of systems, all others reviewed and negative  Physical Exam:  Vitals:   01/23/17 1550  BP: 122/60  Pulse: 64  Temp: 98 F (36.7 C)  TempSrc: Oral  SpO2: 99%  Weight: 248 lb 11.2 oz (112.8 kg)  Height: 5\' 10"  (1.778 m)   Physical Exam  Constitutional: He is well-developed, well-nourished, and in no distress. No distress.  HENT:  Head: Normocephalic and atraumatic.  Eyes: EOM are normal.  Right eye amblyopia   Cardiovascular: Normal rate and regular rhythm.   No murmur heard. Pulmonary/Chest: Effort normal and breath sounds normal. He has no wheezes. He has no rales.  Neurological:  Mild right lip droop, Word finding difficulties and word substitution, right hand strength 2/5, left 5/5  Skin: He is not diaphoretic.    Assessment & Plan:   Moya Moya disease  Continues to have difficulties with word finding and substitution ( to me these are improving since the time of the last visit) right hand weakness, and right mouth numbness and facial droop. Since the time of last office visit he has gone to [facility] specialty clinic where they discussed the option for revascularization. He has a follow up scheduled with them in late September and has been doing online research to find out more about the procedure. 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 At last neuro visit he was told that he could return to driving and work when short term disability is expired. He has tried  driving socially on his own and had no problems, it felt good for him to get back into the community and improved the depression that he is having related to his situation. His family is concerned with his continued right hand weakness that he should not yet return to driving for work. He feels that he would like to continue the work with OT and SLP for a few more months and extend his short term disability until October so that he can focus on the therapy.  - form completed for request of extension of short term disability  - congratulated on smoking cessation  - continue aspirin indeffinitely  - continue plavix until 9/26 then stop - continue pravastatin 20 mg daily  - continue monitoring for hypertension and DM   See Encounters Tab for problem based charting.  Patient discussed with Dr. Rogelia Boga

## 2017-01-23 NOTE — Patient Instructions (Signed)
Manuel Boyer,   It was a pleasure to see you today.   Please schedule a follow up appointment with Dr. Obie Dredge in 6 months  Please call the clinic if you have any questions or need anything before then.

## 2017-01-24 ENCOUNTER — Ambulatory Visit: Payer: BLUE CROSS/BLUE SHIELD

## 2017-01-24 ENCOUNTER — Ambulatory Visit: Payer: BLUE CROSS/BLUE SHIELD | Admitting: Occupational Therapy

## 2017-01-24 DIAGNOSIS — I69351 Hemiplegia and hemiparesis following cerebral infarction affecting right dominant side: Secondary | ICD-10-CM

## 2017-01-24 DIAGNOSIS — R482 Apraxia: Secondary | ICD-10-CM

## 2017-01-24 DIAGNOSIS — R4701 Aphasia: Secondary | ICD-10-CM

## 2017-01-24 DIAGNOSIS — M6281 Muscle weakness (generalized): Secondary | ICD-10-CM

## 2017-01-24 DIAGNOSIS — I69318 Other symptoms and signs involving cognitive functions following cerebral infarction: Secondary | ICD-10-CM

## 2017-01-24 DIAGNOSIS — R41841 Cognitive communication deficit: Secondary | ICD-10-CM

## 2017-01-24 DIAGNOSIS — R278 Other lack of coordination: Secondary | ICD-10-CM

## 2017-01-24 NOTE — Assessment & Plan Note (Addendum)
Continues to have difficulties with word finding and substitution ( to me these are improving since the time of the last visit) right hand weakness, and right mouth numbness and facial droop. Since the time of last office visit he has gone to [facility] specialty clinic where they discussed the option for revascularization. He has a follow up scheduled with them in late September and has been doing online research to find out more about the procedure. 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 At last neuro visit he was told that he could return to driving and work when short term disability is expired. He has tried driving socially on his own and had no problems, it felt good for him to get back into the community and improved the depression that he is having related to his situation. His family is concerned with his continued right hand weakness that he should not yet return to driving for work. He feels that he would like to continue the work with OT and SLP for a few more months and extend his short term disability until October so that he can focus on the therapy.  - form completed for request of extension of short term disability  - congratulated on smoking cessation  - continue aspirin indeffinitely  - continue plavix until 9/26 then stop - continue pravastatin 20 mg daily  - continue monitoring for hypertension and DM

## 2017-01-24 NOTE — Patient Instructions (Signed)
  Please complete the assigned speech therapy homework prior to your next session and return it to the speech therapist at your next visit. Bring your devotional to next visit.

## 2017-01-24 NOTE — Therapy (Signed)
[facility] [address] [place], [place], [address] Phone: [phone]   Fax:  [phone]  Speech Language Pathology Treatment  Patient Details  Name: Manuel Boyer MRN: [id number] Date of Birth: [birth year] Referring Provider: Gust Rung, DO  Encounter Date: 01/24/2017      End of Session - 01/24/17 1653    Visit Number 14   Number of Visits 24   Date for SLP Re-Evaluation 02/09/17   SLP Start Time 1406   SLP Stop Time  1446   SLP Time Calculation (min) 40 min   Activity Tolerance Patient tolerated treatment well      Past Medical History:  Diagnosis Date  . Internal carotid artery dissection (HCC)   . Moya moya disease   . Stroke [facility])     Past Surgical History:  Procedure Laterality Date  . IR ANGIO INTRA EXTRACRAN SEL COM CAROTID INNOMINATE BILAT MOD SED  11/28/2016  . IR ANGIO VERTEBRAL SEL VERTEBRAL BILAT MOD SED  11/28/2016  . RADIOLOGY WITH ANESTHESIA N/A 11/28/2016   Procedure: RADIOLOGY WITH ANESTHESIA;  Surgeon: Julieanne Cotton, MD;  Location: [facility] OR;  Service: Radiology;  Laterality: N/A;    There were no vitals filed for this visit.             ADULT SLP TREATMENT - 01/24/17 1413      General Information   Behavior/Cognition Alert;Cooperative;Pleasant mood     Treatment Provided   Treatment provided Cognitive-Linquistic     Cognitive-Linquistic Treatment   Treatment focused on Cognition   Skilled Treatment Pt reports reading copmrehension is better, but memory for what he read is not better. Pt cont to have aphasic errors when he reads out loud, heard today.  Discussed compensations for memory for reading and pt stated written notes are better than underlining/highlighting because then he does not have to return to the text but can just look at his notes. Pt read article on Moyamoya online and took notes, answered questions 88% success. Pt stated that therapy is helpful for  him to practice writing down notes. Pt does this with his devotional in the AMs. SLP asked pt to bring devotional to next visit.     Assessment / Recommendations / Plan   Plan Continue with current plan of care     Progression Toward Goals   Progression toward goals Progressing toward goals            SLP Short Term Goals - 01/22/17 1138      SLP SHORT TERM GOAL #1   Title Pt will name 8 items for simple categories with occasional min A.   Status Achieved     SLP SHORT TERM GOAL #2   Title pt will demo understanding of 10 minutes of simple-mod complex conversation over two sessions   Status Achieved     SLP SHORT TERM GOAL #3   Title pt will generate simple sentences responding to therapy stimuli with of average 5 words over two sessions   Status Achieved     SLP SHORT TERM GOAL #4   Title Pt will participate in assessment of cognitive communication within first 4 sessions.   Status Achieved          SLP Long Term Goals - 01/24/17 1655      SLP LONG TERM GOAL #1   Title Pt will utilize compensations for aphasia 80% of opportunities in simple conversation with occasional min A   Status Achieved  SLP LONG TERM GOAL #2   Title pt will participate functionally in a 15 minute mod complex conversation in 2 sessions   Status Achieved     SLP LONG TERM GOAL #3   Title Pt will demo understanding of 15 min simple to mod complex conversation with repeats allowed x2.   Status Achieved     SLP LONG TERM GOAL #4   Title pt will tell SLP of using a memory strategy in home environment x5 times (when asked)   Time 3   Period Weeks   Status On-going     SLP LONG TERM GOAL #5   Title pt will demo [facility] speech/language in a mod complex/complex conversation of 20 minutes   Time 3   Period Weeks   Status On-going     SLP LONG TERM GOAL #6   Title pt will demo understanding of a passage of 4-5 paragraphs using compensatory strategies (re-read, notes, etc)   Time 3   Period  Weeks   Status On-going          Plan - 01/24/17 1654    Clinical Impression Statement Pt's success with speech and language fluidity in conversation continues as good-excellent, given compensations. In a reading task today, pt required to take notes for enhanced comprehension - mostly successful with content questions afterwards. Continue ST intervention is recommended to facilitate effective communication, improve quality of life and increase likelihood of return to work.   Speech Therapy Frequency 3x / week   Duration --  8 weeks   Treatment/Interventions Cueing hierarchy;Functional tasks;Patient/family education;Multimodal communcation approach;Cognitive reorganization;Language facilitation;Internal/external aids;SLP instruction and feedback;Compensatory techniques   Potential to Achieve Goals Good   Potential Considerations Family/community support;Ability to learn/carryover information;Previous level of function;Cooperation/participation level   SLP Home Exercise Plan reviewed and provided   Consulted and Agree with Plan of Care Patient;Family member/caregiver   Family Member Consulted daughter      Patient will benefit from skilled therapeutic intervention in order to improve the following deficits and impairments:   Cognitive communication deficit  Aphasia  Apraxia    Problem List Patient Active Problem List   Diagnosis Date Noted  . Internal carotid artery dissection (HCC) 12/10/2016  . Moya moya disease 12/10/2016  . History of tobacco use 12/10/2016  . Prediabetes 12/06/2016  . Hyperlipidemia 12/06/2016  . CVA (cerebral vascular accident) (HCC) 11/28/2016    [facility] ,MS, CCC-SLP  01/24/2017, 4:57 PM  [facility] [address] [place], [place], [address] Phone: [phone]   Fax:  [phone]   Name: Manuel Boyer MRN: [id number] Date of Birth: Sep 08, 1990

## 2017-01-24 NOTE — Therapy (Signed)
[facility] [address] [place], [place], [address] Phone: [phone]   Fax:  [phone]  Occupational Therapy Treatment  Patient Details  Name: Manuel Boyer MRN: [id number] Date of Birth: Dec 07, 1990 Referring Provider: Dr. Carlynn Purl  Encounter Date: 01/24/2017      OT End of Session - 01/24/17 1543    Visit Number 13   Number of Visits 16   Date for OT Re-Evaluation 02/07/17   Authorization Type [employer] 35 visits for OT   Authorization - Visit Number 13   Authorization - Number of Visits 35   OT Start Time 1447   OT Stop Time 1530   OT Time Calculation (min) 43 min   Activity Tolerance Patient tolerated treatment well   Behavior During Therapy [facility] for tasks assessed/performed      Past Medical History:  Diagnosis Date  . Internal carotid artery dissection (HCC)   . Moya moya disease   . Stroke [facility])     Past Surgical History:  Procedure Laterality Date  . IR ANGIO INTRA EXTRACRAN SEL COM CAROTID INNOMINATE BILAT MOD SED  11/28/2016  . IR ANGIO VERTEBRAL SEL VERTEBRAL BILAT MOD SED  11/28/2016  . RADIOLOGY WITH ANESTHESIA N/A 11/28/2016   Procedure: RADIOLOGY WITH ANESTHESIA;  Surgeon: Julieanne Cotton, MD;  Location: [facility] OR;  Service: Radiology;  Laterality: N/A;    There were no vitals filed for this visit.          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  Patient has been driving - known city streets - choosing his routes carefully.  Patient is hoping to return to work.  He was able today to push therapist in a wheelchair throughout the clinic, up and down ramp, safely tip chair, etc.  Patient is currently working on improving his physical conditioning to help him return to this work.      Leisure Patient brought in djembe drum today, and worked on returning to playing.  Patient able to use right hand as dominant to keep beat, and left hand used more for tone.  Patient utilizes whole hand to play drum.. Discussed other drum interests - patient used to use drum sticks for full drum set - but he no longer has this drum set.  Patient really enjoys drumming, and is excited to return to this activity.  He is exploring option of joining a drum group - not for performaces but from a leisure pursuit.       Neurological Re-education Exercises   Hand Gripper with Large Beads Able to move 5 blocks on third setting, able to quickly move 30 blocks on second setting.                    OT Short Term Goals - 01/22/17 1014      OT SHORT TERM GOAL #1   Title Pt will be mod I with HEP- 01/10/2017   Status Achieved     OT SHORT TERM GOAL #2   Title Pt will be mod I with bathing   Status Achieved     OT SHORT TERM GOAL #3   Title Pt will be  mod I with shower transfers   Status Achieved     OT SHORT TERM GOAL #4   Title Pt will be mod I using electric razor   Status Achieved     OT SHORT TERM GOAL #5   Title Pt will feed self at least 50% of meals with RUE with AE prn   Status Achieved     OT SHORT TERM GOAL #6   Title Pt will be able to write first name legibly using AE prn   Status Achieved     OT SHORT TERM GOAL #7   Title Pt will use RUE as gross assist at least 25% of the time  for simple familiar hot meal prep and require no more than supervision    Status Achieved           OT Long Term Goals - 01/24/17 1523      OT LONG TERM GOAL #2   Title (P)  Pt will eat 100% of meals with RUE with AE prn   Status (P)  Achieved     OT LONG TERM GOAL #3   Title (P)  Pt will use RUE as dominant at least 75% of the time for home mgmt tasks.    Status (P)  On-going     OT LONG TERM GOAL #4   Title (P)  Pt will be able to play simple song on drums using both hands    Status (P)  Achieved     OT LONG TERM GOAL #5   Title (P)  Pt and family will verbalize understanding of driving eval recommendations.    Status (P)  Achieved     OT LONG TERM GOAL #6   Title (P)  Pt will demonstrate ability to write 3-4 sentence paragraph with 100% legiblity using pen grip only   Status (P)  Achieved     OT LONG TERM GOAL #7   Title (P)  Pt will be able to type short email with no more than 3 errors.    Status (P)  On-going     OT LONG TERM GOAL #8   Title (P)  Pt will demonstate at least 18  pounds of grip strength to assist with functional tasks   Status (P)  Achieved  20 lbs               Plan - 01/24/17 1544    Clinical Impression Statement Patient continues to show improvement toward OT goal achievement.     Rehab Potential Good   Current Impairments/barriers affecting progress: aphasia and apraxia   OT Frequency 2x / week   OT Duration 8 weeks   OT Treatment/Interventions Self-care/ADL training;Electrical Stimulation;Therapeutic exercise;Neuromuscular education;DME and/or AE instruction;Passive range of motion;Manual Therapy;Splinting;Therapeutic activities;Patient/family education   Plan functional use of right hand - especially pinch, grip, manipulation (larger obj)   OT Home Exercise Plan addedd ball handling exercises with tennis ball   Consulted and Agree with Plan of Care Patient      Patient will benefit from skilled therapeutic intervention in order to improve the following deficits and impairments:  Decreased cognition, Decreased coordination, Decreased range of motion, Decreased knowledge of use of DME, Decreased strength, Impaired UE functional use, Other (comment)  Visit Diagnosis: Apraxia  Hemiplegia and hemiparesis following cerebral infarction affecting right dominant side (HCC)  Muscle weakness (generalized)  Other lack of coordination  Other symptoms and signs involving cognitive functions following cerebral  infarction    Problem List Patient  Active Problem List   Diagnosis Date Noted  . Internal carotid artery dissection (HCC) 12/10/2016  . Moya moya disease 12/10/2016  . History of tobacco use 12/10/2016  . Prediabetes 12/06/2016  . Hyperlipidemia 12/06/2016  . CVA (cerebral vascular accident) [facility]) 11/28/2016    Collier Salina, OTR/L 01/24/2017, 3:47 PM  [facility] [address] [place], [place], [address] Phone: [phone]   Fax:  [phone]  Name: Moataz Tavis MRN: [id number] Date of Birth: [birth year]

## 2017-01-26 ENCOUNTER — Ambulatory Visit: Payer: BLUE CROSS/BLUE SHIELD

## 2017-01-26 ENCOUNTER — Encounter: Payer: BLUE CROSS/BLUE SHIELD | Admitting: Occupational Therapy

## 2017-01-26 DIAGNOSIS — I69351 Hemiplegia and hemiparesis following cerebral infarction affecting right dominant side: Secondary | ICD-10-CM | POA: Diagnosis not present

## 2017-01-26 DIAGNOSIS — R4701 Aphasia: Secondary | ICD-10-CM

## 2017-01-26 DIAGNOSIS — R41841 Cognitive communication deficit: Secondary | ICD-10-CM

## 2017-01-26 DIAGNOSIS — R482 Apraxia: Secondary | ICD-10-CM

## 2017-01-26 NOTE — Progress Notes (Signed)
Internal Medicine Clinic Attending  Case discussed with Dr. Blum at the time of the visit.  We reviewed the resident's history and exam and pertinent patient test results.  I agree with the assessment, diagnosis, and plan of care documented in the resident's note. 

## 2017-01-26 NOTE — Therapy (Signed)
[facility] [address] [place], [place], [address] Phone: [phone]   Fax:  [phone]  Speech Language Pathology Treatment  Patient Details  Name: Manuel Boyer MRN: [id number] Date of Birth: [birth year] Referring Provider: Gust Rung, DO  Encounter Date: 01/26/2017      End of Session - 01/26/17 1647    Visit Number 15   Number of Visits 24   Date for SLP Re-Evaluation 02/09/17   SLP Start Time 1404   SLP Stop Time  1445   SLP Time Calculation (min) 41 min   Activity Tolerance Patient tolerated treatment well      Past Medical History:  Diagnosis Date  . Internal carotid artery dissection (HCC)   . Moya moya disease   . Stroke [facility])     Past Surgical History:  Procedure Laterality Date  . IR ANGIO INTRA EXTRACRAN SEL COM CAROTID INNOMINATE BILAT MOD SED  11/28/2016  . IR ANGIO VERTEBRAL SEL VERTEBRAL BILAT MOD SED  11/28/2016  . RADIOLOGY WITH ANESTHESIA N/A 11/28/2016   Procedure: RADIOLOGY WITH ANESTHESIA;  Surgeon: Julieanne Cotton, MD;  Location: [facility] OR;  Service: Radiology;  Laterality: N/A;    There were no vitals filed for this visit.      Subjective Assessment - 01/26/17 1408    Subjective "Pt brought notebook with notes on devotionals in today.    Currently in Pain? No/denies               ADULT SLP TREATMENT - 01/26/17 1409      General Information   Behavior/Cognition Alert;Cooperative;Pleasant mood     Treatment Provided   Treatment provided Cognitive-Linquistic     Cognitive-Linquistic Treatment   Treatment focused on Aphasia   Skilled Treatment Pt brought in his notes from his devotions the last two days and read to SLP, min questioning cues from SLP needed for comprehension. In reading tasks with pt taking notes, recall and comprehension was WFL/WNL, with some extended time.      Assessment / Recommendations / Plan   Plan Continue with current plan of care      Progression Toward Goals   Progression toward goals Progressing toward goals            SLP Short Term Goals - 01/22/17 1138      SLP SHORT TERM GOAL #1   Title Pt will name 8 items for simple categories with occasional min A.   Status Achieved     SLP SHORT TERM GOAL #2   Title pt will demo understanding of 10 minutes of simple-mod complex conversation over two sessions   Status Achieved     SLP SHORT TERM GOAL #3   Title pt will generate simple sentences responding to therapy stimuli with of average 5 words over two sessions   Status Achieved     SLP SHORT TERM GOAL #4   Title Pt will participate in assessment of cognitive communication within first 4 sessions.   Status Achieved          SLP Long Term Goals - 01/26/17 1649      SLP LONG TERM GOAL #1   Title Pt will utilize compensations for aphasia 80% of opportunities in simple conversation with occasional min A   Status Achieved     SLP LONG TERM GOAL #2   Title pt will participate functionally in a 15 minute mod complex conversation in 2 sessions   Status Achieved     SLP  LONG TERM GOAL #3   Title Pt will demo understanding of 15 min simple to mod complex conversation with repeats allowed x2.   Status Achieved     SLP LONG TERM GOAL #4   Title pt will tell SLP of using a memory strategy in home environment x5 times (when asked)   Time 3   Period Weeks   Status On-going     SLP LONG TERM GOAL #5   Title pt will demo [facility] speech/language in a mod complex/complex conversation of 20 minutes x3 visits   Baseline 01-26-17   Time 3   Period Weeks   Status Revised     SLP LONG TERM GOAL #6   Title pt will demo understanding of a passage of 4-5 paragraphs using compensatory strategies (re-read, notes, etc)   Time 3   Period Weeks   Status On-going          Plan - 01/26/17 1648    Clinical Impression Statement Pt's success with speech and language fluidity in conversation continues as good-excellent,  given compensations. In a reading task today, pt again took notes for enhanced comprehension, recall -pt was successful with content questions afterwards. Continue ST intervention is recommended to facilitate effective communication, improve quality of life and increase likelihood of return to work. Pt progress warrants x2/week - will discuss with pt next week.   Speech Therapy Frequency 3x / week   Duration --  8 eweks   Treatment/Interventions Cueing hierarchy;Functional tasks;Patient/family education;Multimodal communcation approach;Cognitive reorganization;Language facilitation;Internal/external aids;SLP instruction and feedback;Compensatory techniques   Potential to Achieve Goals Good   Potential Considerations Family/community support;Ability to learn/carryover information;Previous level of function;Cooperation/participation level   SLP Home Exercise Plan reviewed and provided   Consulted and Agree with Plan of Care Patient;Family member/caregiver   Family Member Consulted daughter      Patient will benefit from skilled therapeutic intervention in order to improve the following deficits and impairments:   Aphasia  Apraxia  Cognitive communication deficit    Problem List Patient Active Problem List   Diagnosis Date Noted  . Internal carotid artery dissection (HCC) 12/10/2016  . Moya moya disease 12/10/2016  . History of tobacco use 12/10/2016  . Prediabetes 12/06/2016  . Hyperlipidemia 12/06/2016  . CVA (cerebral vascular accident) (HCC) 11/28/2016    [facility] ,MS, CCC-SLP  01/26/2017, 4:49 PM  [facility] [address] [place], [place], [address] Phone: [phone]   Fax:  [phone]   Name: Artin Bouch MRN: [id number] Date of Birth: Oct 26, 1990

## 2017-01-29 ENCOUNTER — Encounter: Payer: Self-pay | Admitting: Occupational Therapy

## 2017-01-29 ENCOUNTER — Ambulatory Visit: Payer: BLUE CROSS/BLUE SHIELD

## 2017-01-29 ENCOUNTER — Ambulatory Visit: Payer: BLUE CROSS/BLUE SHIELD | Admitting: Occupational Therapy

## 2017-01-29 DIAGNOSIS — R482 Apraxia: Secondary | ICD-10-CM

## 2017-01-29 DIAGNOSIS — I69351 Hemiplegia and hemiparesis following cerebral infarction affecting right dominant side: Secondary | ICD-10-CM

## 2017-01-29 DIAGNOSIS — R278 Other lack of coordination: Secondary | ICD-10-CM

## 2017-01-29 DIAGNOSIS — R41841 Cognitive communication deficit: Secondary | ICD-10-CM

## 2017-01-29 DIAGNOSIS — I69318 Other symptoms and signs involving cognitive functions following cerebral infarction: Secondary | ICD-10-CM

## 2017-01-29 DIAGNOSIS — M6281 Muscle weakness (generalized): Secondary | ICD-10-CM

## 2017-01-29 NOTE — Therapy (Signed)
[facility] [address] [place], [place], [address] Phone: [phone]   Fax:  [phone]  Speech Language Pathology Treatment  Patient Details  Name: Manuel Boyer MRN: [id number] Date of Birth: 1991-01-05 Referring Provider: Gust Rung, DO  Encounter Date: 01/29/2017      End of Session - 01/29/17 1428    Visit Number 16   Number of Visits 24   Date for SLP Re-Evaluation 02/09/17   SLP Start Time 1022   SLP Stop Time  1102   SLP Time Calculation (min) 40 min   Activity Tolerance --      Past Medical History:  Diagnosis Date  . Internal carotid artery dissection (HCC)   . Moya moya disease   . Stroke [facility])     Past Surgical History:  Procedure Laterality Date  . IR ANGIO INTRA EXTRACRAN SEL COM CAROTID INNOMINATE BILAT MOD SED  11/28/2016  . IR ANGIO VERTEBRAL SEL VERTEBRAL BILAT MOD SED  11/28/2016  . RADIOLOGY WITH ANESTHESIA N/A 11/28/2016   Procedure: RADIOLOGY WITH ANESTHESIA;  Surgeon: Julieanne Cotton, MD;  [facility] OR;  Service: Radiology;  Laterality: N/A;    There were no vitals filed for this visit.      Subjective Assessment - 01/29/17 1028    Subjective Pt brought notebook with notes on devotionals in today.    Currently in Pain? No/denies               ADULT SLP TREATMENT - 01/29/17 1036      General Information   Behavior/Cognition Alert;Cooperative;Pleasant mood     Treatment Provided   Treatment provided Cognitive-Linquistic     Cognitive-Linquistic Treatment   Treatment focused on Aphasia   Skilled Treatment Pt's language ability in 10 minutes conversation (mod-max complex) functional. SLP asked pt if he was agreeable to once/week and he stated he needs to talk it over with his wife. ST to end next week on same date as last OT session (Wednesday).  Pt explained his devotional yesterday from his notes functionally to SLP but Saturday's was more problematic  requiring pt to look back at the bible text.  Pt took notes on a reading passage and answered questions 98% success using notes. Gave pt the option of cancelling Wednesday appt and working at home with reading/note taking. Pt to talk it over with wife.     Assessment / Recommendations / Plan   Plan Continue with current plan of care     Progression Toward Goals   Progression toward goals Progressing toward goals            SLP Short Term Goals - 01/22/17 1138      SLP SHORT TERM GOAL #1   Title Pt will name 8 items for simple categories with occasional min A.   Status Achieved     SLP SHORT TERM GOAL #2   Title pt will demo understanding of 10 minutes of simple-mod complex conversation over two sessions   Status Achieved     SLP SHORT TERM GOAL #3   Title pt will generate simple sentences responding to therapy stimuli with of average 5 words over two sessions   Status Achieved     SLP SHORT TERM GOAL #4   Title Pt will participate in assessment of cognitive communication within first 4 sessions.   Status Achieved          SLP Long Term Goals - 01/29/17 1432  SLP LONG TERM GOAL #1   Title Pt will utilize compensations for aphasia 80% of opportunities in simple conversation with occasional min A   Status Achieved     SLP LONG TERM GOAL #2   Title pt will participate functionally in a 15 minute mod complex conversation in 2 sessions   Status Achieved     SLP LONG TERM GOAL #3   Title Pt will demo understanding of 15 min simple to mod complex conversation with repeats allowed x2.   Status Achieved     SLP LONG TERM GOAL #4   Title pt will tell SLP of using a memory strategy in home environment x5 times (when asked)   Time 3   Period Weeks   Status On-going     SLP LONG TERM GOAL #5   Title pt will demo [facility] speech/language in a mod complex/complex conversation of 20 minutes x3 visits   Baseline 01-26-17, 01-29-17   Time 3   Period Weeks   Status On-going      SLP LONG TERM GOAL #6   Title pt will demo understanding of a passage of 4-5 paragraphs using compensatory strategies (re-read, notes, etc)   Status Achieved          Plan - 01/29/17 1429    Clinical Impression Statement Pt's success with speech and language fluidity in conversation continues as good-excellent, given compensations. In another reading task today, pt took notes for enhanced comprehension, recall -pt was 98% successful with content questions afterwards. Suggested to pt to cancel Wednesday and work at home with reading/note taking, he will talk with wife. Next week agreed upon d/c from skilled ST. From speech perspective pt is OK to return to work. Continue ST intervention thought next week is recommended to ensure effective verbal communication over time, improve quality of life in reading comprehension/memory.   Speech Therapy Frequency 3x / week   Duration --  8 eweks   Treatment/Interventions Cueing hierarchy;Functional tasks;Patient/family education;Multimodal communcation approach;Cognitive reorganization;Language facilitation;Internal/external aids;SLP instruction and feedback;Compensatory techniques   Potential to Achieve Goals Good   Potential Considerations Family/community support;Ability to learn/carryover information;Previous level of function;Cooperation/participation level   SLP Home Exercise Plan reviewed and provided   Consulted and Agree with Plan of Care Patient;Family member/caregiver   Family Member Consulted daughter      Patient will benefit from skilled therapeutic intervention in order to improve the following deficits and impairments:   Apraxia  Cognitive communication deficit    Problem List Patient Active Problem List   Diagnosis Date Noted  . Internal carotid artery dissection (HCC) 12/10/2016  . Moya moya disease 12/10/2016  . History of tobacco use 12/10/2016  . Prediabetes 12/06/2016  . Hyperlipidemia 12/06/2016  . CVA (cerebral  vascular accident) (HCC) 11/28/2016    [facility] ,MS, CCC-SLP  01/29/2017, 2:34 PM  [facility] [address] [place], [place], [address] Phone: [phone]   Fax:  [phone]   Name: Manuel Boyer MRN: [id number] Date of Birth: 08-30-1990

## 2017-01-29 NOTE — Therapy (Signed)
[facility] [address] [place], [place], [address] Phone: [phone]   Fax:  [phone]  Occupational Therapy Treatment  Patient Details  Name: Manuel Boyer MRN: [id number] Date of Birth: 09/07/1990 Referring Provider: Dr. Carlynn Purl  Encounter Date: 01/29/2017      OT End of Session - 01/29/17 1233    Visit Number 14   Number of Visits 16   Date for OT Re-Evaluation 02/07/17   Authorization Type [employer] 35 visits for OT   Authorization - Visit Number 14   Authorization - Number of Visits 35   OT Start Time 0931   OT Stop Time 1014   OT Time Calculation (min) 43 min   Activity Tolerance Patient tolerated treatment well      Past Medical History:  Diagnosis Date  . Internal carotid artery dissection (HCC)   . Moya moya disease   . Stroke [facility])     Past Surgical History:  Procedure Laterality Date  . IR ANGIO INTRA EXTRACRAN SEL COM CAROTID INNOMINATE BILAT MOD SED  11/28/2016  . IR ANGIO VERTEBRAL SEL VERTEBRAL BILAT MOD SED  11/28/2016  . RADIOLOGY WITH ANESTHESIA N/A 11/28/2016   Procedure: RADIOLOGY WITH ANESTHESIA;  Surgeon: Julieanne Cotton, MD;  Location: [facility] OR;  Service: Radiology;  Laterality: N/A;    There were no vitals filed for this visit.      Subjective Assessment - 01/29/17 0933    Subjective  I am playing my drum more and it feels good.  All that tingling in my fingers has gone away. It stills feels kind of like I am wearing a glove tho.    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 No/denies                      OT Treatments/Exercises (OP) - 01/29/17 0001      ADLs   Writing Addressed typing an email. Pt's perforrmance improves when he looks at hand due to sensory issues. Pt with 3 errors but able to  quickly self correct. Pt needs increased time. Pt with overall improved performance.       Exercises   Exercises Hand     Hand Exercises   Theraputty - Locate Pegs Red putty with pennies first the with pegs.  Pt able do complete pennies without difficulty  Pt able to do pegs with minimal difficulty. Pt requires extra time to manipulate peg in hand in order to orient to peg board.    Hand Gripper with Small Beads Able to do 5 blocks again on #3 then remainder on #2. Pt with more difficulty today - moderate - and required frequent rest breaks. Feel performance is likely impacted by apraxia.                   OT Short Term Goals - 01/29/17 1231      OT SHORT TERM GOAL #1   Title Pt will be mod I with HEP- 01/10/2017   Status Achieved     OT SHORT TERM GOAL #2   Title Pt will be mod I with bathing   Status Achieved     OT SHORT TERM GOAL #3   Title Pt will be mod I with shower transfers   Status Achieved  OT SHORT TERM GOAL #4   Title Pt will be mod I using electric razor   Status Achieved     OT SHORT TERM GOAL #5   Title Pt will feed self at least 50% of meals with RUE with AE prn   Status Achieved     OT SHORT TERM GOAL #6   Title Pt will be able to write first name legibly using AE prn   Status Achieved     OT SHORT TERM GOAL #7   Title Pt will use RUE as gross assist at least 25% of the time  for simple familiar hot meal prep and require no more than supervision    Status Achieved           OT Long Term Goals - 01/29/17 1231      OT LONG TERM GOAL #1   Title Pt will be mod I with upgraded HEP- 02/07/2017   Status On-going     OT LONG TERM GOAL #2   Title Pt will eat 100% of meals with RUE with AE prn   Status Achieved     OT LONG TERM GOAL #3   Title Pt will use RUE as dominant at least 75% of the time for home mgmt tasks.    Status On-going     OT LONG TERM GOAL #4   Title Pt will be able to play simple song on drums using both hands   Status  Achieved     OT LONG TERM GOAL #5   Title Pt and family will verbalize understanding of driving eval recommendations.    Status Achieved     OT LONG TERM GOAL #6   Title Pt will demonstrate ability to write 3-4 sentence paragraph with 100% legiblity using pen grip only   Status Achieved     OT LONG TERM GOAL #7   Title Pt will be able to type short email with no more than 3 errors.    Status Achieved     OT LONG TERM GOAL #8   Title Pt will demonstate at least 18  pounds of grip strength to assist with functional tasks   Status Achieved  20 lbs               Plan - 01/29/17 1232    Clinical Impression Statement Pt with good progress toward goals.  Pt improving with in hand manipulation of small items   Rehab Potential Good   Current Impairments/barriers affecting progress: aphasia and apraxia   OT Frequency 2x / week   OT Duration 8 weeks   OT Treatment/Interventions Self-care/ADL training;Electrical Stimulation;Therapeutic exercise;Neuromuscular education;DME and/or AE instruction;Passive range of motion;Manual Therapy;Splinting;Therapeutic activities;Patient/family education   Plan functional use of R hand - especially pinch, grip and manipulation   Consulted and Agree with Plan of Care Patient      Patient will benefit from skilled therapeutic intervention in order to improve the following deficits and impairments:  Decreased cognition, Decreased coordination, Decreased range of motion, Decreased knowledge of use of DME, Decreased strength, Impaired UE functional use, Other (comment)  Visit Diagnosis: Apraxia  Hemiplegia and hemiparesis following cerebral infarction affecting right dominant side (HCC)  Muscle weakness (generalized)  Other lack of coordination  Other symptoms and signs involving cognitive functions following cerebral infarction    Problem List Patient Active Problem List   Diagnosis Date Noted  . Internal carotid artery dissection (HCC)  12/10/2016  . Moya moya disease 12/10/2016  . History  of tobacco use 12/10/2016  . Prediabetes 12/06/2016  . Hyperlipidemia 12/06/2016  . CVA (cerebral vascular accident) [facility]) 11/28/2016    Norton Pastel, OTR/L 01/29/2017, 12:36 PM  [facility] [address] [place], [place], [address] Phone: [phone]   Fax:  [phone]  Name: Malakye Nolden MRN: [id number] Date of Birth: 10-04-1990

## 2017-01-31 ENCOUNTER — Ambulatory Visit: Payer: BLUE CROSS/BLUE SHIELD | Admitting: Occupational Therapy

## 2017-01-31 ENCOUNTER — Ambulatory Visit: Payer: BLUE CROSS/BLUE SHIELD

## 2017-01-31 DIAGNOSIS — R4701 Aphasia: Secondary | ICD-10-CM

## 2017-01-31 DIAGNOSIS — R482 Apraxia: Secondary | ICD-10-CM

## 2017-01-31 DIAGNOSIS — I69351 Hemiplegia and hemiparesis following cerebral infarction affecting right dominant side: Secondary | ICD-10-CM | POA: Diagnosis not present

## 2017-01-31 NOTE — Therapy (Signed)
[place] [address] [place], [place], [address] Phone: [phone]   Fax:  [phone]  Speech Language Pathology Treatment  Patient Details  Name: Manuel Boyer MRN: [id number] Date of Birth: 1991/05/13 Referring Provider: Lucious Groves, DO  Encounter Date: 01/31/2017      End of Session - 01/31/17 1404    Visit Number 17   Number of Visits 24   Date for SLP Re-Evaluation 02/09/17   SLP Start Time 23   SLP Stop Time  1400   SLP Time Calculation (min) 42 min   Activity Tolerance Patient tolerated treatment well      Past Medical History:  Diagnosis Date  . Internal carotid artery dissection ([place])   . Moya moya disease   . Stroke [facility])     Past Surgical History:  Procedure Laterality Date  . IR ANGIO INTRA EXTRACRAN SEL COM CAROTID INNOMINATE BILAT MOD SED  11/28/2016  . IR ANGIO VERTEBRAL SEL VERTEBRAL BILAT MOD SED  11/28/2016  . RADIOLOGY WITH ANESTHESIA N/A 11/28/2016   Procedure: RADIOLOGY WITH ANESTHESIA;  Surgeon: Luanne Bras, MD;  Location: [place];  Service: Radiology;  Laterality: N/A;    There were no vitals filed for this visit.      Subjective Assessment - 01/31/17 1322    Subjective Pt reports going well with reading and taking notes.   Currently in Pain? No/denies               ADULT SLP TREATMENT - 01/31/17 1322      General Information   Behavior/Cognition Alert;Cooperative;Pleasant mood     Treatment Provided   Treatment provided Cognitive-Linquistic     Cognitive-Linquistic Treatment   Treatment focused on Aphasia   Skilled Treatment Pt would like to cancel remaining ST - states he will cont to work on reading/note taking at home. Pt engaged in conversation outside [place] room in mod complex/complex conversation with extra time but WFL/WNL completion. When back in [place] room pt took notes on 4-5 paragraph selections and answered questions 100% success. Pt requests  d/c at this time and SLP agrees.      Assessment / Recommendations / Plan   Plan Discharge SLP treatment due to (comment)  goals met     Progression Toward Goals   Progression toward goals --  d/c day - see goal update            SLP Short Term Goals - 01/22/17 1138      SLP SHORT TERM GOAL #1   Title Pt will name 8 items for simple categories with occasional min A.   Status Achieved     SLP SHORT TERM GOAL #2   Title pt will demo understanding of 10 minutes of simple-mod complex conversation over two sessions   Status Achieved     SLP SHORT TERM GOAL #3   Title pt will generate simple sentences responding to therapy stimuli with of average 5 words over two sessions   Status Achieved     SLP SHORT TERM GOAL #4   Title Pt will participate in assessment of cognitive communication within first 4 sessions.   Status Achieved          SLP Long Term Goals - 01/31/17 1328      SLP LONG TERM GOAL #1   Title Pt will utilize compensations for aphasia 80% of opportunities in simple conversation with occasional min A   Status Achieved     SLP LONG  TERM GOAL #2   Title pt will participate functionally in a 15 minute mod complex conversation in 2 sessions   Status Achieved     SLP LONG TERM GOAL #3   Title Pt will demo understanding of 15 min simple to mod complex conversation with repeats allowed x2.   Status Achieved     SLP LONG TERM GOAL #4   Title pt will tell SLP of using a memory strategy in home environment x5 times (when asked)   Time 3   Period Weeks   Status Partially Met     SLP LONG TERM GOAL #5   Title pt will demo [facility] speech/language in a mod complex/complex conversation of 20 minutes x3 visits   Baseline 01-26-17, 01-29-17, 01-31-17   Time 3   Period Weeks   Status Achieved     SLP LONG TERM GOAL #6   Title pt will demo understanding of a passage of 4-5 paragraphs using compensatory strategies (re-read, notes, etc)   Status Achieved          Plan  - 01/31/17 1405    Clinical Impression Statement Pt's success with speech and language fluidity in conversation continues as good-excellent, given compensations. Reading task today, pt took notes for enhanced comprehension, recall -pt was 98% successful with content questions afterwards. Suggested to pt to cancel Wednesday and work at home with reading/note taking, he will talk with wife. Next week agreed upon d/c from skilled ST. From speech perspective pt is OK to return to work. Continue ST intervention thought next week is recommended to ensure effective verbal communication over time, improve quality of life in reading comprehension/memory.      Patient will benefit from skilled therapeutic intervention in order to improve the following deficits and impairments:   Apraxia  Aphasia   SPEECH THERAPY DISCHARGE SUMMARY  Visits from Start of Care: 17  Current functional level related to goals / functional outcomes: Please see goals above. Pt made wonderful progress with speech/language ability using speech rate compensations. He requested d/c today due to progress and his level of comfort continuing therapy tasks at home in a self-directed fashion. Pt continues to use reading comprehension/memory compensations with success at home.   Remaining deficits: Mild aphasia/apraxia, reading comprehension/memory deficits.   Education / Equipment: Speech compensations, memory/reading compensations.  Plan: Patient agrees to discharge.  Patient goals were partially met. Patient is being discharged due to being pleased with the current functional level.  ?????(pt's request).       Problem List Patient Active Problem List   Diagnosis Date Noted  . Internal carotid artery dissection ([place]) 12/10/2016  . Moya moya disease 12/10/2016  . History of tobacco use 12/10/2016  . Prediabetes 12/06/2016  . Hyperlipidemia 12/06/2016  . CVA (cerebral vascular accident) ([place]) 11/28/2016    [facility]  ,[place], [place]  01/31/2017, 4:59 PM  [place] [address] [place], [place], [address] Phone: [phone]   Fax:  [phone]   Name: Manuel Boyer MRN: [id number] Date of Birth: 03/22/1991

## 2017-02-07 ENCOUNTER — Encounter: Payer: BLUE CROSS/BLUE SHIELD | Admitting: Occupational Therapy

## 2017-02-09 ENCOUNTER — Encounter: Payer: BLUE CROSS/BLUE SHIELD | Admitting: Occupational Therapy

## 2017-02-14 ENCOUNTER — Encounter: Payer: Self-pay | Admitting: Occupational Therapy

## 2017-02-14 NOTE — Therapy (Signed)
[facility] [address] [place], [place], [address] Phone: [phone]   Fax:  [phone]  Patient Details  Name: Jermie Hippe MRN: [id number] Date of Birth: 01-27-1991 Referring Provider:  No ref. provider found  Encounter Date: 02/14/2017 OCCUPATIONAL THERAPY DISCHARGE SUMMARY  Visits from Start of Care: 14 Current functional level related to goals / functional outcomes: Patient has made significant improvement with functional strength and use of dominant right hand,    Remaining deficits: Diminished sensation    Education / Equipment: HEP Plan: Patient agrees to discharge.  Patient goals were partially met. Patient is being discharged due to meeting the stated rehab goals.  ?????      Mariah Milling, OTR/L 02/14/2017, 2:48 PM  [facility] [address] [place], [place], [address] Phone: [phone]   Fax:  [phone]

## 2017-03-25 ENCOUNTER — Other Ambulatory Visit: Payer: Self-pay | Admitting: Internal Medicine

## 2017-03-28 ENCOUNTER — Other Ambulatory Visit: Payer: Self-pay | Admitting: *Deleted

## 2017-03-28 MED ORDER — PRAVASTATIN SODIUM 20 MG PO TABS: 20.0000 mg | ORAL_TABLET | Freq: Every day | ORAL | 3 refills | Status: DC

## 2017-03-28 MED ORDER — ASPIRIN 81 MG PO TBEC: 81.0000 mg | DELAYED_RELEASE_TABLET | Freq: Every day | ORAL | 3 refills | Status: DC

## 2017-04-10 ENCOUNTER — Encounter: Payer: Self-pay | Admitting: Neurology

## 2017-04-10 ENCOUNTER — Ambulatory Visit (INDEPENDENT_AMBULATORY_CARE_PROVIDER_SITE_OTHER): Payer: BLUE CROSS/BLUE SHIELD | Admitting: Neurology

## 2017-04-10 VITALS — BP 130/72 | HR 65 | Wt 257.2 lb

## 2017-04-10 DIAGNOSIS — I675 Moyamoya disease: Secondary | ICD-10-CM

## 2017-04-10 MED ORDER — PRAVASTATIN SODIUM 20 MG PO TABS: 20.0000 mg | ORAL_TABLET | Freq: Every day | ORAL | 3 refills | Status: DC

## 2017-04-10 NOTE — Progress Notes (Signed)
[facility] [address] [place]. [place] [address] [phone]       OFFICE FOLLOW-UP NOTE  Manuel. Manuel Boyer Date of Birth:  01-31-1991 Medical Record Number:  [id number]   HPI: Initial visit 01/05/2017 ; Manuel Boyer is a 25 year pleasant African-American male seen today for first office follow-up visit following hospital admission for stroke in June 2018. Manuel Boyer is an 26 y.o. male with no significant past medical history. Approximately 5 days prior to admission on 11/28/16 patient noticed some neck discomfort with slurred speech and some right arm weakness. Apparently last night at 2000 hrs. patient was noted it worse and has significant word finding difficulties and right arm weakness. 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 There is also noted hypodensity in the left frontal lobe most compatible with a subacute infarct. The occlusion the left carotid internal artery with a taper lumen as noted above was consistent with a dissection. 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 As far as familypatient does not have sickle cell. 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 Due to the multiple arterial abnormalities secondary to moyamoya and the leptomeningeal collaterals patient is going to be sent to interventional radiology to get a conventional angiogram to further evaluate if this isn't M1 occlusion and if this is possible to intervene on. Date last known well: Date: 11/23/2016 Time last known well: Time: 20:00 tPA Given: No: out of window.. Diagnostic labs were collected and vascular disease, lupus, sickle cell, vasculitic disorders and hypercarbia states were all negative. LDL cholesterol was 104mg  percent. Hb A1c was 5.7. Transthoraxic echo showed normal ejection  fraction. Patient did give a history of habitual neck popping about to 3 times a day. He denied any significant neck injury or chiropractic ventilation in the weeks or months prior to his presentation. Cerebral catheter angiogram performed by Dr. showed occlusion of the terminal right internal carotid artery with dilated leptomeningeal collaterals. The left internal carotid artery was occluded close to its origin with a flame-shaped appearance. Patient was started on dual antiplatelet therapy aspirin and Plavix and on statin for his lipids. He states his done well since discharge. He has not had any problems with bleeding or bruising. His blood pressure is well controlled and today it is [id number]. He is getting outpatient physical occupational speech therapy. His speech is improved but he still has to struggle to find words in complete sentences. Is also noticed diminished fine motor skills in his hand as well as some intermittent tingling and numbness as well. 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 The patient is also getting a second opinion at [facility] for his moyamoya and carotid dissection and has an appointment to see Dr. [facility] in the next few weeks. Update 04/10/2017 ; he returns for follow-up after last visit with me 3 months ago.  He states he is doing well he still has some word finding difficulties and speech difficulties but these appear to be improving.  He has had no recurrent stroke or TIA symptoms.  Is tolerating aspirin well without bruising and bleeding.  For unclear reasons he seems to have stopped the Pravachol.  He tells me that his primary physician  asked him to do so but when I reviewed the primary physician Notes I clearly did not see any mention of this.  I have counseled the patient and advised him to start taking Pravachol again.  He was seen on consultation [facility] Dr[facility] Eric  Hauck [facility]who did a cerebral catheter angiogram on 03/30/17 which confirmed bilateral left greater than right changes of moyamoya and has recommended dural encephalo synangiosis surgery which is scheduled for January 9.. I have reviewed these records in care everywhere.  He has no new complaints today. ROS:   14 system review of systems is positive for double vision, snoring and all other systems negative  PMH:  Past Medical History:  Diagnosis Date  . Internal carotid artery dissection (HCC)   . Moya moya disease   . Stroke [facility](HCC)     Social History:  Social History   Socioeconomic History  . Marital status: Married    Spouse name: Not on file  . Number of children: Not on file  . Years of education: Not on file  . Highest education level: Not on file  Social Needs  . Financial resource strain: Not on file  . Food insecurity - worry: Not on file  . Food insecurity - inability: Not on file  . Transportation needs - medical: Not on file  . Transportation needs - non-medical: Not on file  Occupational History  . Not on file  Tobacco Use  . Smoking status: Former Smoker    Packs/day: 3.00    Types: Cigarettes  . Smokeless tobacco: Never Used  Substance and Sexual Activity  . Alcohol use: No  . Drug use: Yes    Types: Marijuana    Comment: last took June 2018  . Sexual activity: Not on file  Other Topics Concern  . Not on file  Social History Narrative  . Not on file    Medications:   Current Outpatient Medications on File Prior to Visit  Medication Sig Dispense Refill  . aspirin EC 81 MG tablet Take by mouth.     No current facility-administered medications on file prior to visit.     Allergies:  No Known Allergies  Physical Exam General: well developed, well nourished Overweight young African-American male, seated, in no evident distress Head: head normocephalic and atraumatic.  Neck: supple with no carotid or supraclavicular  bruits Cardiovascular: regular rate and rhythm, no murmurs Musculoskeletal: no deformity Skin:  no rash/petichiae Vascular:  Normal pulses all extremities Vitals:   04/10/17 1450  BP: 130/72  Pulse: 65   Neurologic Exam Mental Status: Awake and fully alert. Oriented to place and time. Recent and remote memory intact. Attention span, concentration and fund of knowledge appropriate. Mood and affect appropriate. Mild expressive aphasia with slightly nonfluent speech with some word finding difficulties and occasional paraphasic errors. Able to repeat comprehend and name well. Cranial Nerves: Fundoscopic exam not done. Pupils equal, briskly reactive to light. Extraocular movements full without nystagmus. Visual fields full to confrontation. Hearing intact. Facial sensation intact. Mild right lower facial asymmetry on smiling., tongue, palate moves normally and symmetrically.  Motor: Normal bulk and tone. Normal strength in all tested extremity muscles except mild weakness of right grip and fine finger movements in the right hand. Orbits left over right upper extremity.. Sensory.: intact to touch ,pinprick .position and vibratory sensation.  Coordination: Rapid alternating movements normal in all extremities. Finger-to-nose and heel-to-shin performed accurately bilaterally. Gait and Station: Arises from chair without  difficulty. Stance is normal. Gait demonstrates normal stride length and balance . Able to heel, toe and tandem walk without difficulty.  Reflexes: 1+ and symmetric. Toes downgoing.       ASSESSMENT: [age]  year African-American male with left MCA branch infarct in June 2018 likely due to proximal cervical carotid artery dissection with occlusion and likely distal stump emboli Interesting cerebral angiogram finding suggestive of moyamoya disease with terminal right ICA occlusion with prominent leptomeningeal collaterals. Negative workup for vasculitis and hypercoagulable  state.    PLAN: I had a long discussion with the patient , wife and mother regarding his prior stroke and moyamoya disease and reviewed consultation notes from [facility] from Dr Alinda MoneyEric Hauck.  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  He was advised to eat a healthy diet and be active and lose weight.  He will return for follow-up in 3 months or call earlier if necessary. Greater than 50% of time during this 35 minute visit was spent on counseling,explanation of diagnosis of carotid dissection, moyamoya disease, stroke and aphasia, planning of further management, discussion with patient and family and coordination of care Delia HeadyPramod Sethi, MD  [facility] [address] [place], [place] [id number]  Phone [phone] Fax [phone] Note: This document was prepared with digital dictation and possible smart phrase technology. Any transcriptional errors that result from this process are unintentional

## 2017-04-10 NOTE — Patient Instructions (Addendum)
I had a long discussion with the patient regarding his prior stroke and moyamoya disease and reviewed [facility].  I agree with decision to do elective revascularization of the left brain with dural synangiosis Continue aspirin for stroke prevention and I counseled the patient to restart pravastatin 20 mg daily.  He was advised to eat a healthy diet and be active and lose weight.  He will return for follow-up in 3 months or call earlier if necessary.

## 2017-04-17 ENCOUNTER — Ambulatory Visit (INDEPENDENT_AMBULATORY_CARE_PROVIDER_SITE_OTHER): Payer: BLUE CROSS/BLUE SHIELD | Admitting: Internal Medicine

## 2017-04-17 ENCOUNTER — Encounter: Payer: Self-pay | Admitting: Internal Medicine

## 2017-04-17 VITALS — BP 130/63 | HR 68 | Temp 98.3°F | Wt 259.1 lb

## 2017-04-17 DIAGNOSIS — F17211 Nicotine dependence, cigarettes, in remission: Secondary | ICD-10-CM

## 2017-04-17 DIAGNOSIS — Z23 Encounter for immunization: Secondary | ICD-10-CM | POA: Diagnosis not present

## 2017-04-17 DIAGNOSIS — Z7982 Long term (current) use of aspirin: Secondary | ICD-10-CM | POA: Diagnosis not present

## 2017-04-17 DIAGNOSIS — Z7902 Long term (current) use of antithrombotics/antiplatelets: Secondary | ICD-10-CM

## 2017-04-17 DIAGNOSIS — I675 Moyamoya disease: Secondary | ICD-10-CM | POA: Diagnosis not present

## 2017-04-17 DIAGNOSIS — I2542 Coronary artery dissection: Secondary | ICD-10-CM

## 2017-04-17 DIAGNOSIS — Z79899 Other long term (current) drug therapy: Secondary | ICD-10-CM | POA: Diagnosis not present

## 2017-04-17 NOTE — Progress Notes (Signed)
   CC: Follow up of moya moya   HPI:  Manuel Boyer is a 26 y.o. 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 Please see the assessment and plans for the status of the patient chronic medical problems.   Past Medical History:  Diagnosis Date  . Internal carotid artery dissection (HCC)   . Moya moya disease   . Stroke [facility](HCC)    Review of Systems:  Refer to history of present illness and assessment and plans for pertinent review of systems, all others reviewed and negative  Physical Exam:  Vitals:   04/17/17 1401  BP: 130/63  Pulse: 68  Temp: 98.3 F (36.8 C)  TempSrc: Oral  SpO2: 100%  Weight: 259 lb 1.6 oz (117.5 kg)   General: well appearing  HEENT: b/l strabismus  Cardiac : RRR, no murmur appreciated  Pulm: lungs clear to auscultation bilateral  Neuro: strength 4/5 right hand, 5/5 left hand, mild dysarthria   Assessment & Plan:   Cerebrovascular disease, Moya Moya  Came to the clinic today to discuss long term management for this, recently had a change in this mgmt when he went from DAPT with plavix and aspirin to taking aspirin alone in September. Unfortunately there may have been misunderstanding at that time and he had stopped taking Pravastatin on his own as well. Strongly encouraged him to start taking pravastatin once again along with plavix, exercising, working on what was learned with OT and congratulated him on smoking cessation. He is [facility] ( [facility]  - continue aspirin and pravastatin - continue following with [facility] and Dr. Pearlean BrownieSethi  - encouraged on tobacco cessation   Hyperlipidemia  Initially had LDL 100 at the time of CVA, has not been on pravastatin for the past month but I would like to see improvement in his LDL to goal of <70 for risk factor modification of cerebrovascular disease.  -restart pravastatin 20 mg daily  - RTC in 1-2 months  for lab only visit for repeat lipid panel and screening A1c as well   Health maintenance  - refused flu  - received td   See Encounters Tab for problem based charting.  Patient discussed with Dr. Oswaldo DoneVincent

## 2017-04-17 NOTE — Patient Instructions (Addendum)
It was a pleasure to see you today [name]  - For your high cholesterol, start taking the pravastatin 20 mg daily again then come back to the clinic in 1-2 months for a LAB ONLY appointment to recheck your cholesterol and A1c ( check for diabetes) you wont have to see a doctor and I'll call you with the results  - To keep your blood thin, continue taking the aspirin  - today well give you the tdap vaccine  - schedule a follow up appointment with me in 1 year or sooner if you need anything  - Please call our clinic if you have any problems or questions, we may be able to help you and keep you from a long emergency room wait. Our clinic and after hours phone number is [phone]       Mediterranean Diet  Why follow it? Research shows. . Those who follow the Mediterranean diet have a reduced risk of heart disease  . The diet is associated with a reduced incidence of Parkinson's and Alzheimer's diseases . People following the diet may have longer life expectancies and lower rates of chronic diseases  . The Dietary Guidelines for Americans recommends the Mediterranean diet as an eating plan to promote health and prevent disease  What Is the Mediterranean Diet?  . Healthy eating plan based on typical foods and recipes of Mediterranean-style cooking . The diet is primarily a plant based diet; these foods should make up a majority of meals   Starches - Plant based foods should make up a majority of meals - They are an important sources of vitamins, minerals, energy, antioxidants, and fiber - Choose whole grains, foods high in fiber and minimally processed items  - Typical grain sources include wheat, oats, barley, corn, brown rice, bulgar, farro, millet, polenta, couscous  - Various types of beans include chickpeas, lentils, fava beans, black beans, white beans   Fruits  Veggies - Large quantities of antioxidant rich fruits & veggies; 6 or more servings  - Vegetables can be eaten raw or  lightly drizzled with oil and cooked  - Vegetables common to the traditional Mediterranean Diet include: artichokes, arugula, beets, broccoli, brussel sprouts, cabbage, carrots, celery, collard greens, cucumbers, eggplant, kale, leeks, lemons, lettuce, mushrooms, okra, onions, peas, peppers, potatoes, pumpkin, radishes, rutabaga, shallots, spinach, sweet potatoes, turnips, zucchini - Fruits common to the Mediterranean Diet include: apples, apricots, avocados, cherries, clementines, dates, figs, grapefruits, grapes, melons, nectarines, oranges, peaches, pears, pomegranates, strawberries, tangerines  Fats - Replace butter and margarine with healthy oils, such as olive oil, canola oil, and tahini  - Limit nuts to no more than a handful a day  - Nuts include walnuts, almonds, pecans, pistachios, pine nuts  - Limit or avoid candied, honey roasted or heavily salted nuts - Olives are central to the [employer]iterranean diet - can be eaten whole or used in a variety of dishes   Meats Protein - Limiting red meat: no more than a few times a month - When eating red meat: choose lean cuts and keep the portion to the size of deck of cards - Eggs: approx. 0 to 4 times a week  - Fish and lean poultry: at least 2 a week  - Healthy protein sources include, chicken, Malaw[place]key, lean beef, lamb - Increase intake of seafood such as tuna, salmon, trout, mackerel, shrimp, scallops - Avoid or limit high fat processed meats such as sausage and bacon  Dairy - Include moderate amounts of low fat  dairy products  - Focus on healthy dairy such as fat free yogurt, skim milk, low or reduced fat cheese - Limit dairy products higher in fat such as whole or 2% milk, cheese, ice cream  Alcohol - Moderate amounts of red wine is ok  - No more than 5 oz daily for women (all ages) and men older than age 28  - No more than 10 oz of wine daily for men younger than 3565  Other - Limit sweets and other desserts  - Use herbs and spices instead of  salt to flavor foods  - Herbs and spices common to the traditional Mediterranean Diet include: basil, bay leaves, chives, cloves, cumin, fennel, garlic, lavender, marjoram, mint, oregano, parsley, pepper, rosemary, sage, savory, sumac, tarragon, thyme   It's not just a diet, it's a lifestyle:  . The Mediterranean diet includes lifestyle factors typical of those in the region  . Foods, drinks and meals are best eaten with others and savored . Daily physical activity is important for overall good health . This could be strenuous exercise like running and aerobics . This could also be more leisurely activities such as walking, housework, yard-work, or taking the stairs . Moderation is the key; a balanced and healthy diet accommodates most foods and drinks . Consider portion sizes and frequency of consumption of certain foods   Meal Ideas & Options:  . Breakfast:  o Whole wheat toast or whole wheat English muffins with peanut butter & hard boiled egg o Steel cut oats topped with apples & cinnamon and skim milk  o Fresh fruit: banana, strawberries, melon, berries, peaches  o Smoothies: strawberries, bananas, greek yogurt, peanut butter o Low fat greek yogurt with blueberries and granola  o Egg white omelet with spinach and mushrooms o Breakfast couscous: whole wheat couscous, apricots, skim milk, cranberries  . Sandwiches:  o Hummus and grilled vegetables (peppers, zucchini, squash) on whole wheat bread   o Grilled chicken on whole wheat pita with lettuce, tomatoes, cucumbers or tzatziki  o Tuna salad on whole wheat bread: tuna salad made with greek yogurt, olives, red peppers, capers, green onions o Garlic rosemary lamb pita: lamb sauted with garlic, rosemary, salt & pepper; add lettuce, cucumber, greek yogurt to pita - flavor with lemon juice and black pepper  . Seafood:  o Mediterranean grilled salmon, seasoned with garlic, basil, parsley, lemon juice and black pepper o Shrimp, lemon, and  spinach whole-grain pasta salad made with low fat greek yogurt  o Seared scallops with lemon orzo  o Seared tuna steaks seasoned salt, pepper, coriander topped with tomato mixture of olives, tomatoes, olive oil, minced garlic, parsley, green onions and cappers  . Meats:  o Herbed greek chicken salad with kalamata olives, cucumber, feta  o Red bell peppers stuffed with spinach, bulgur, lean ground beef (or lentils) & topped with feta   o Kebabs: skewers of chicken, tomatoes, onions, zucchini, squash  o Mal[place]urkey burgers: made with red onions, mint, dill, lemon juice, feta cheese topped with roasted red peppers . Vegetarian o Cucumber salad: cucumbers, artichoke hearts, celery, red onion, feta cheese, tossed in olive oil & lemon juice  o Hummus and whole grain pita points with a greek salad (lettuce, tomato, feta, olives, cucumbers, red onion) o Lentil soup with celery, carrots made with vegetable broth, garlic, salt and pepper  o Tabouli salad: parsley, bulgur, mint, scallions, cucumbers, tomato, radishes, lemon juice, olive oil, salt and pepper.

## 2017-04-18 ENCOUNTER — Encounter: Payer: Self-pay | Admitting: Internal Medicine

## 2017-04-18 NOTE — Assessment & Plan Note (Signed)
Came to the clinic today to discuss long term management for this, recently had a change in this mgmt when he went from DAPT with plavix and aspirin to taking aspirin alone in September. Unfortunately there may have been misunderstanding at that time and he had stopped taking Pravastatin on his own as well. Strongly encouraged him to start taking pravastatin once again along with plavix, exercising, working on what was learned with OT and congratulated him on smoking cessation. [facility] is [facility] ( [facility]  - continue aspirin and atorvastatin  - continue following with [facility] and Dr. Pearlean BrownieSethi  - encouraged on tobacco cessation

## 2017-04-19 NOTE — Progress Notes (Signed)
Internal Medicine Clinic Attending  Case discussed with Dr. Blum at the time of the visit.  We reviewed the resident's history and exam and pertinent patient test results.  I agree with the assessment, diagnosis, and plan of care documented in the resident's note. 

## 2017-06-26 ENCOUNTER — Ambulatory Visit (INDEPENDENT_AMBULATORY_CARE_PROVIDER_SITE_OTHER): Payer: BLUE CROSS/BLUE SHIELD | Admitting: Internal Medicine

## 2017-06-26 ENCOUNTER — Other Ambulatory Visit: Payer: Self-pay

## 2017-06-26 ENCOUNTER — Encounter: Payer: Self-pay | Admitting: Internal Medicine

## 2017-06-26 VITALS — BP 119/70 | HR 63 | Temp 98.8°F | Ht 71.0 in | Wt 258.0 lb

## 2017-06-26 DIAGNOSIS — I7771 Dissection of carotid artery: Secondary | ICD-10-CM | POA: Diagnosis not present

## 2017-06-26 DIAGNOSIS — Z8673 Personal history of transient ischemic attack (TIA), and cerebral infarction without residual deficits: Secondary | ICD-10-CM | POA: Diagnosis not present

## 2017-06-26 DIAGNOSIS — I675 Moyamoya disease: Secondary | ICD-10-CM | POA: Diagnosis not present

## 2017-06-26 DIAGNOSIS — R7303 Prediabetes: Secondary | ICD-10-CM | POA: Diagnosis not present

## 2017-06-26 DIAGNOSIS — R5383 Other fatigue: Secondary | ICD-10-CM | POA: Insufficient documentation

## 2017-06-26 DIAGNOSIS — E785 Hyperlipidemia, unspecified: Secondary | ICD-10-CM | POA: Diagnosis not present

## 2017-06-26 DIAGNOSIS — Z79899 Other long term (current) drug therapy: Secondary | ICD-10-CM

## 2017-06-26 DIAGNOSIS — E7800 Pure hypercholesterolemia, unspecified: Secondary | ICD-10-CM

## 2017-06-26 DIAGNOSIS — Z87891 Personal history of nicotine dependence: Secondary | ICD-10-CM | POA: Diagnosis not present

## 2017-06-26 DIAGNOSIS — Z7982 Long term (current) use of aspirin: Secondary | ICD-10-CM | POA: Diagnosis not present

## 2017-06-26 NOTE — Assessment & Plan Note (Signed)
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 Have not yet rechecked a lipid panel to evaluate for improvement in LDL. Denies any side effects of the pravastatin.  - lipid panel today, unfortunately the patient left before this was obtained but he will come back for a lab collection   - cont pravastatin 20 mg qd

## 2017-06-26 NOTE — Assessment & Plan Note (Signed)
A1c was 5.7 at the time of diagnosis of moya moya. In an attempt to modify his risk factors for having a recurrent stroke, will need to monitor for development of diabetes. Denies polyuria or nocturia, polydipsia. Random blood glucose x5 have been 100 since the time of that A1c. He has not met criteria for a diagnosis of prediabetes at this point.  - continue to monitor for symptoms of DM

## 2017-06-26 NOTE — Assessment & Plan Note (Signed)
Concern for generalized fatigue throughout the day. There are some issues with sleep hygiene - he goes to sleep at 10 pm and wakes up at 3:30 in the morning, he feels completely awake at that time and is unable to return to sleep. He feels tired throughout the day and takes a 1-2 hour nap at some point in the day. He watches TV while falling asleep at night, he does not drink caffeine through the day. Since the time of anastamosis surgery at [facility] ( January 9th) he's been having headaches at times throughout the day, he has been told that he snores in his sleep but never that he stops breathing in his sleep, he denies nocturia. He denies constipation, dry skin, cold symptoms or unexplained weight gain. Last BMP and CBC did not show explanation for why he would be fatigued.  - trial of improved sleep hygiene, consider sleep study if symptoms aren't improved at follow up after this trial  - Thyroid function screening

## 2017-06-26 NOTE — Patient Instructions (Signed)
Manuel Boyer,   It was a pleasure to see you today   Continue taking the aspirin and statin  I will call you with the results of the labs that we took today and let you know if you need to make any changes.   Schedule a follow up apt with me in 1 year, or sooner if you have any concerns. Continue to follow up with your neurologist and neurosurgeons until then   FOLLOW-UP INSTRUCTIONS When: 1 year  For: general check up, neuro exam   What to bring: all of your medications   - Please call our clinic if you have any problems or questions, we may be able to help you and keep you from a long emergency room wait. Our clinic and after hours phone number is [phone]       Mediterranean Diet  Why follow it? Research shows. . Those who follow the Mediterranean diet have a reduced risk of heart disease  . The diet is associated with a reduced incidence of Parkinson's and Alzheimer's diseases . People following the diet may have longer life expectancies and lower rates of chronic diseases  . The Dietary Guidelines for Americans recommends the Mediterranean diet as an eating plan to promote health and prevent disease  What Is the Mediterranean Diet?  . Healthy eating plan based on typical foods and recipes of Mediterranean-style cooking . The diet is primarily a plant based diet; these foods should make up a majority of meals   Starches - Plant based foods should make up a majority of meals - They are an important sources of vitamins, minerals, energy, antioxidants, and fiber - Choose whole grains, foods high in fiber and minimally processed items  - Typical grain sources include wheat, oats, barley, corn, brown rice, bulgar, farro, millet, polenta, couscous  - Various types of beans include chickpeas, lentils, fava beans, black beans, white beans   Fruits  Veggies - Large quantities of antioxidant rich fruits & veggies; 6 or more servings  - Vegetables can be eaten raw or lightly drizzled  with oil and cooked  - Vegetables common to the traditional Mediterranean Diet include: artichokes, arugula, beets, broccoli, brussel sprouts, cabbage, carrots, celery, collard greens, cucumbers, eggplant, kale, leeks, lemons, lettuce, mushrooms, okra, onions, peas, peppers, potatoes, pumpkin, radishes, rutabaga, shallots, spinach, sweet potatoes, turnips, zucchini - Fruits common to the Mediterranean Diet include: apples, apricots, avocados, cherries, clementines, dates, figs, grapefruits, grapes, melons, nectarines, oranges, peaches, pears, pomegranates, strawberries, tangerines  Fats - Replace butter and margarine with healthy oils, such as olive oil, canola oil, and tahini  - Limit nuts to no more than a handful a day  - Nuts include walnuts, almonds, pecans, pistachios, pine nuts  - Limit or avoid candied, honey roasted or heavily salted nuts - Olives are central to the [employer] - can be eaten whole or used in a variety of dishes   Meats Protein - Limiting red meat: no more than a few times a month - When eating red meat: choose lean cuts and keep the portion to the size of deck of cards - Eggs: approx. 0 to 4 times a week  - Fish and lean poultry: at least 2 a week  - Healthy protein sources include, chicken, [place], lean beef, lamb - Increase intake of seafood such as tuna, salmon, trout, mackerel, shrimp, scallops - Avoid or limit high fat processed meats such as sausage and bacon  Dairy - Include moderate amounts of low fat  dairy products  - Focus on healthy dairy such as fat free yogurt, skim milk, low or reduced fat cheese - Limit dairy products higher in fat such as whole or 2% milk, cheese, ice cream  Alcohol - Moderate amounts of red wine is ok  - No more than 5 oz daily for women (all ages) and men older than age 565  - No more than 10 oz of wine daily for men younger than 6865  Other - Limit sweets and other desserts  - Use herbs and spices instead of salt to flavor foods   - Herbs and spices common to the traditional Mediterranean Diet include: basil, bay leaves, chives, cloves, cumin, fennel, garlic, lavender, marjoram, mint, oregano, parsley, pepper, rosemary, sage, savory, sumac, tarragon, thyme   It's not just a diet, it's a lifestyle:  . The Mediterranean diet includes lifestyle factors typical of those in the region  . Foods, drinks and meals are best eaten with others and savored . Daily physical activity is important for overall good health . This could be strenuous exercise like running and aerobics . This could also be more leisurely activities such as walking, housework, yard-work, or taking the stairs . Moderation is the key; a balanced and healthy diet accommodates most foods and drinks . Consider portion sizes and frequency of consumption of certain foods   Meal Ideas & Options:  . Breakfast:  o Whole wheat toast or whole wheat English muffins with peanut butter & hard boiled egg o Steel cut oats topped with apples & cinnamon and skim milk  o Fresh fruit: banana, strawberries, melon, berries, peaches  o Smoothies: strawberries, bananas, greek yogurt, peanut butter o Low fat greek yogurt with blueberries and granola  o Egg white omelet with spinach and mushrooms o Breakfast couscous: whole wheat couscous, apricots, skim milk, cranberries  . Sandwiches:  o Hummus and grilled vegetables (peppers, zucchini, squash) on whole wheat bread   o Grilled chicken on whole wheat pita with lettuce, tomatoes, cucumbers or tzatziki  o Tuna salad on whole wheat bread: tuna salad made with greek yogurt, olives, red peppers, capers, green onions o Garlic rosemary lamb pita: lamb sauted with garlic, rosemary, salt & pepper; add lettuce, cucumber, greek yogurt to pita - flavor with lemon juice and black pepper  . Seafood:  o Mediterranean grilled salmon, seasoned with garlic, basil, parsley, lemon juice and black pepper o Shrimp, lemon, and spinach whole-grain  pasta salad made with low fat greek yogurt  o Seared scallops with lemon orzo  o Seared tuna steaks seasoned salt, pepper, coriander topped with tomato mixture of olives, tomatoes, olive oil, minced garlic, parsley, green onions and cappers  . Meats:  o Herbed greek chicken salad with kalamata olives, cucumber, feta  o Red bell peppers stuffed with spinach, bulgur, lean ground beef (or lentils) & topped with feta   o Kebabs: skewers of chicken, tomatoes, onions, zucchini, squash  o [place]urkey burgers: made with red onions, mint, dill, lemon juice, feta cheese topped with roasted red peppers . Vegetarian o Cucumber salad: cucumbers, artichoke hearts, celery, red onion, feta cheese, tossed in olive oil & lemon juice  o Hummus and whole grain pita points with a greek salad (lettuce, tomato, feta, olives, cucumbers, red onion) o Lentil soup with celery, carrots made with vegetable broth, garlic, salt and pepper  o Tabouli salad: parsley, bulgur, mint, scallions, cucumbers, tomato, radishes, lemon juice, olive oil, salt and pepper.

## 2017-06-26 NOTE — Progress Notes (Addendum)
   CC: follow up of hyperlipidemia   HPI:  [name] is a 27 y.o. 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 Please see the assessment and plans for the status of the patient chronic medical problems.   Past Medical History:  Diagnosis Date  . Internal carotid artery dissection ([place])   . Moya moya disease   . Stroke [facility])    Review of Systems:  Refer to history of present illness and assessment and plans for pertinent review of systems, all others reviewed and negative  Physical Exam:  Vitals:   06/26/17 1329  BP: 119/70  Pulse: 63  Temp: 98.8 F (37.1 C)  TempSrc: Oral  SpO2: 100%  Weight: 258 lb (117 kg)  Height: '5\' 11"'$  (1.803 m)   General: well appearing, no acute distress  Cardiac: RRR, no murmur appreciated  Pulm: lungs clear to auscultation, no respiratory distress  Neuro: CN II -XII grossly intact, some word finding difficulties, grip strength 4/5 right and 5/5 left, upper and lower extremity strength otherwise 5/5   Skin: well healing linear incision over the left scull, staples overlying are intact   Assessment & Plan:   Fatigue  Concern for generalized fatigue throughout the day. There are some issues with sleep hygiene - he goes to sleep at 10 pm and wakes up at 3:30 in the morning, he feels completely awake at that time and is unable to return to sleep. He feels tired throughout the day and takes a 1-2 hour nap at some point in the day. He watches TV while falling asleep at night, he does not drink caffeine through the day. Since the time of anastamosis surgery at [facility] ( January 9th) he's been having headaches at times throughout the day, he has been told that he snores in his sleep but never that he stops breathing in his sleep, he denies nocturia. He denies constipation, dry skin, cold symptoms or unexplained weight gain. Last BMP and CBC did not show explanation for why he would be fatigued.  - trial  of improved sleep hygiene, consider sleep study if symptoms aren't improved at follow up after this trial  - Thyroid function screening    HLD  On pravastatin 20 mg qd for hyperlipidemia ( LDL 105) at the time of diagnosis of cerebral infarction from [place] in June 2018. Have not yet rechecked a lipid panel to evaluate for improvement in LDL. Denies any side effects of the pravastatin.  - lipid panel today, unfortunately the patient left before this was obtained but he will come back for a lab collection   - cont pravastatin 20 mg qd  ADDENDUM: LDL improved to 74, this is at goal recommended by the specialist with neurology, will continue pravastatin at 20 mg daily   PreDM   A1c was 5.7 at the time of diagnosis of moya moya. In an attempt to modify his risk factors for having a recurrent stroke, will need to monitor for development of diabetes. Denies polyuria or nocturia, polydipsia. Random blood glucose x5 have been 100 since the time of that A1c. He has not met criteria for a diagnosis of prediabetes at this point.  - continue to monitor for symptoms of DM  See Encounters Tab for problem based charting.  Patient discussed with Dr. Eppie Gibson

## 2017-06-27 NOTE — Progress Notes (Signed)
Case discussed with Dr. Blum at the time of the visit. We reviewed the resident's history and exam and pertinent patient test results. I agree with the assessment, diagnosis, and plan of care documented in the resident's note. 

## 2017-06-29 ENCOUNTER — Other Ambulatory Visit (INDEPENDENT_AMBULATORY_CARE_PROVIDER_SITE_OTHER): Payer: BLUE CROSS/BLUE SHIELD

## 2017-06-29 DIAGNOSIS — I675 Moyamoya disease: Secondary | ICD-10-CM

## 2017-06-29 DIAGNOSIS — R5383 Other fatigue: Secondary | ICD-10-CM | POA: Diagnosis not present

## 2017-06-30 LAB — LIPID PANEL
Chol/HDL Ratio: 3.9 ratio (ref 0.0–5.0)
Cholesterol, Total: 147 mg/dL (ref 100–199)
HDL: 38 mg/dL — ABNORMAL LOW (ref >39)
LDL Calculated: 74 mg/dL (ref 0–99)
Triglycerides: 177 mg/dL — ABNORMAL HIGH (ref 0–149)
VLDL CHOLESTEROL CAL: 35 mg/dL (ref 5–40)

## 2017-06-30 LAB — T3, FREE: T3 FREE: 3.2 pg/mL (ref 2.0–4.4)

## 2017-06-30 LAB — TSH: TSH: 0.473 u[IU]/mL (ref 0.450–4.500)

## 2017-06-30 LAB — HEMOGLOBIN A1C
Est. average glucose Bld gHb Est-mCnc: 120 mg/dL
Hgb A1c MFr Bld: 5.8 % — ABNORMAL HIGH (ref 4.8–5.6)

## 2017-06-30 LAB — T4, FREE: FREE T4: 1 ng/dL (ref 0.82–1.77)

## 2017-07-28 NOTE — Addendum Note (Signed)
Addended by: Earl LagosBLUM, Cathey Fredenburg S on: 07/28/2017 08:46 PM   Modules accepted: Orders

## 2017-07-31 ENCOUNTER — Ambulatory Visit: Payer: BLUE CROSS/BLUE SHIELD | Admitting: Neurology

## 2017-07-31 ENCOUNTER — Encounter: Payer: Self-pay | Admitting: Neurology

## 2017-07-31 VITALS — BP 110/87 | HR 78 | Wt 265.4 lb

## 2017-07-31 DIAGNOSIS — I675 Moyamoya disease: Secondary | ICD-10-CM | POA: Diagnosis not present

## 2017-07-31 NOTE — Progress Notes (Signed)
[facility] [address] [place]. [place] [address] [phone]       OFFICE FOLLOW-UP NOTE  Manuel. Manuel Boyer Date of Birth:  01-31-1991 Medical Record Number:  [id number]   HPI: Initial visit 01/05/2017 ; Manuel Boyer is a 25 year pleasant African-American male seen today for first office follow-up visit following hospital admission for stroke in June 2018. Manuel Boyer is an 27 y.o. male with no significant past medical history. Approximately 5 days prior to admission on 11/28/16 patient noticed some neck discomfort with slurred speech and some right arm weakness. Apparently last night at 2000 hrs. patient was noted it worse and has significant word finding difficulties and right arm weakness. 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 There is also noted hypodensity in the left frontal lobe most compatible with a subacute infarct. The occlusion the left carotid internal artery with a taper lumen as noted above was consistent with a dissection. 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 As far as familypatient does not have sickle cell. 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 Due to the multiple arterial abnormalities secondary to moyamoya and the leptomeningeal collaterals patient is going to be sent to interventional radiology to get a conventional angiogram to further evaluate if this isn't M1 occlusion and if this is possible to intervene on. Date last known well: Date: 11/23/2016 Time last known well: Time: 20:00 tPA Given: No: out of window.. Diagnostic labs were collected and vascular disease, lupus, sickle cell, vasculitic disorders and hypercarbia states were all negative. LDL cholesterol was 104mg  percent. Hb A1c was 5.7. Transthoraxic echo showed normal ejection  fraction. Patient did give a history of habitual neck popping about to 3 times a day. He denied any significant neck injury or chiropractic ventilation in the weeks or months prior to his presentation. Cerebral catheter angiogram performed by Dr. showed occlusion of the terminal right internal carotid artery with dilated leptomeningeal collaterals. The left internal carotid artery was occluded close to its origin with a flame-shaped appearance. Patient was started on dual antiplatelet therapy aspirin and Plavix and on statin for his lipids. He states his done well since discharge. He has not had any problems with bleeding or bruising. His blood pressure is well controlled and today it is [id number]. He is getting outpatient physical occupational speech therapy. His speech is improved but he still has to struggle to find words in complete sentences. Is also noticed diminished fine motor skills in his hand as well as some intermittent tingling and numbness as well. 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 The patient is also getting a second opinion at [facility] for his moyamoya and carotid dissection and has an appointment to see Dr. [facility] in the next few weeks. Update 04/10/2017 ; he returns for follow-up after last visit with me 3 months ago.  He states he is doing well he still has some word finding difficulties and speech difficulties but these appear to be improving.  He has had no recurrent stroke or TIA symptoms.  Is tolerating aspirin well without bruising and bleeding.  For unclear reasons he seems to have stopped the Pravachol.  He tells me that his primary physician  asked him to do so but when I reviewed the primary physician Notes I clearly did not see any mention of this.  I have counseled the patient and advised him to start taking Pravachol again.  He was seen on consultation by Dr. Alinda MoneyEric  Hauck [facility] on [facility]18who did a cerebral catheter angiogram on 03/30/17 which confirmed bilateral left greater than right changes of moyamoya and has recommended dural encephalo synangiosis surgery which is scheduled for January 9.. I have reviewed these records in care everywhere.  He has no new complaints today. Update 07/31/2017 : Returns for follow-up after last visit with me the months ago. He is accompanied by his mother. He underwent left encephaloduroarteriosynangiosis by [facility] Kennis [facility]inaErik [facility]uck [facility] on 06/13/[facility]. I have reviewed these records in care everywhere The surgery went well. He had some transient worsening of his speech difficulties following surgery but that seems to be improving. He still has some hesitant speech with word hesitation and occasional word finding difficulties. His right grip strength is improving but he is to have some weakness. He remains on aspirin which is tolerating well without bleeding or bruising. States his blood pressure is well controlled and today it is 110/84. He remains some protocol which is tolerating well. [facility] few months.     ROS:   14 system review of systems is positive for mild speech difficulties and all other systems negative  PMH:  Past Medical History:  Diagnosis Date  . Internal carotid artery dissection (HCC)   . Moya moya disease   . Stroke [facility](HCC)     Social History:  Social History   Socioeconomic History  . Marital status: Married    Spouse name: Not on file  . Number of children: Not on file  . Years of education: Not on file  . Highest education level: Not on file  Social Needs  . Financial resource strain: Not on file  . Food insecurity - worry: Not on file  . Food insecurity - inability: Not on file  . Transportation needs - medical: Not on file  . Transportation needs - non-medical: Not on file  Occupational History  . Not on file  Tobacco Use  . Smoking  status: Former Smoker    Packs/day: 3.00    Types: Cigarettes  . Smokeless tobacco: Never Used  Substance and Sexual Activity  . Alcohol use: No  . Drug use: Yes    Types: Marijuana    Comment: last took June 2018  . Sexual activity: Not on file  Other Topics Concern  . Not on file  Social History Narrative  . Not on file    Medications:   Current Outpatient Medications on File Prior to Visit  Medication Sig Dispense Refill  . aspirin EC 81 MG tablet Take by mouth.    . loratadine (CLARITIN) 10 MG tablet Take by mouth.    . pravastatin (PRAVACHOL) 20 MG tablet Take 1 tablet (20 mg total) daily by mouth. 30 tablet 3   No current facility-administered medications on file prior to visit.     Allergies:   Allergies  Allergen Reactions  . Uncaria Tomentosa (Cats Claw) Other (See Comments)    snezzing snezzing   . Molds & Smuts Itching    Physical Exam General: well developed, well nourished Overweight young African-American male, seated, in no evident distress Head: head normocephalic and atraumatic.  Neck: supple with no carotid or supraclavicular  bruits Cardiovascular: regular rate and rhythm, no murmurs Musculoskeletal: no deformity Skin:  no rash/petichiae Vascular:  Normal pulses all extremities Vitals:   07/31/17 1457  BP: 110/87  Pulse: 78   Neurologic Exam Mental Status: Awake and fully alert. Oriented to place and time. Recent and remote memory intact. Attention span, concentration and fund of knowledge appropriate. Mood and affect appropriate. Mild expressive aphasia with slightly nonfluent speech with some word finding difficulties   Able to repeat comprehend and name well. Cranial Nerves: Fundoscopic exam not done. Pupils equal, briskly reactive to light. Extraocular movements full without nystagmus. Visual fields full to confrontation. Hearing intact. Facial sensation intact. Mild right lower facial asymmetry on smiling., tongue, palate moves normally and  symmetrically.  Motor: Normal bulk and tone. Normal strength in all tested extremity muscles except mild weakness of right grip and fine finger movements in the right hand. Orbits left over right upper extremity.. Sensory.: intact to touch ,pinprick .position and vibratory sensation.  Coordination: Rapid alternating movements normal in all extremities. Finger-to-nose and heel-to-shin performed accurately bilaterally. Gait and Station: Arises from chair without difficulty. Stance is normal. Gait demonstrates normal stride length and balance . Able to heel, toe and tandem walk without difficulty.  Reflexes: 1+ and symmetric. Toes downgoing.       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 Kennis Carina       PLAN: I had a long discussion with patient and his mother regarding moyamoya disease and his recent brain surgery at [facility] for revascularization for this condition and answered questions. I recommend he continue aspirin and maintain strict control of lipids with LDL cholesterol goal below 70 g percent and hypertension with blood pressure goal below 130/90. Continue scheduled follow-up at [facility] neurosurgery. Return for follow-up with me in 6 months or call earlier if necessary  He was advised to eat a healthy diet and be active and lose weight.    Greater than 50% of time during this 35 minute visit was spent on counseling,explanation of diagnosis of carotid dissection, moyamoya disease, stroke and aphasia, planning of further management, discussion with patient and family and coordination of care Delia Heady, MD  [facility] [address] [place], [place] [address]  Phone [phone] Fax [phone] Note: This document was prepared with digital dictation and possible smart phrase technology. Any transcriptional errors that result from this process are unintentional

## 2017-07-31 NOTE — Patient Instructions (Signed)
I had a long discussion with patient and his mother regarding moyamoya disease and [facility] condition and answered questions. I recommend he continue aspirin and maintain strict control of lipids with LDL cholesterol goal below 70 g percent and hypertension with blood pressure goal below 130/90. [facility]. Return for follow-up with me in 6 months or call earlier if necessary

## 2017-09-19 DIAGNOSIS — Z0271 Encounter for disability determination: Secondary | ICD-10-CM

## 2017-12-03 ENCOUNTER — Encounter: Payer: Self-pay | Admitting: Internal Medicine

## 2018-01-29 ENCOUNTER — Ambulatory Visit: Payer: Commercial Managed Care - PPO | Admitting: Neurology

## 2018-01-29 ENCOUNTER — Encounter: Payer: Self-pay | Admitting: Neurology

## 2018-01-29 VITALS — BP 117/72 | HR 85 | Wt 249.8 lb

## 2018-01-29 DIAGNOSIS — R413 Other amnesia: Secondary | ICD-10-CM

## 2018-01-29 DIAGNOSIS — I675 Moyamoya disease: Secondary | ICD-10-CM

## 2018-01-29 NOTE — Patient Instructions (Signed)
I had a long discussion with the patient and his mother whom I spoke over the phone regarding his mild memory loss and cognitive impairment following a stroke and brain surgery which is likely to be static and not progress. I encouraged him to increase participation in cognitively challenging activities like solving crossword puzzles, playing bridge and sudoku. He also discussed memory compensation strategies. Continue aspirin for stroke prevention and strict control of lipids with LDL cholesterol goal below 70 mg percent and blood pressure goal below 130/time 0. I encouraged him to eat a healthy diet and exercise regularly and lose weight. The family wanted a neuropsych referral but I question its utility unless it will help him pursue his disability case.Return for follow-up in 6 months with minerals practitioner Shanda BumpsJessica call earlier if necessary. Memory Compensation Strategies  1. Use "WARM" strategy.  W= write it down  A= associate it  R= repeat it  M= make a mental note  2.   You can keep a Glass blower/designerMemory Notebook.  Use a 3-ring notebook with sections for the following: calendar, important names and phone numbers,  medications, doctors' names/phone numbers, lists/reminders, and a section to journal what you did  each day.   3.    Use a calendar to write appointments down.  4.    Write yourself a schedule for the day.  This can be placed on the calendar or in a separate section of the Memory Notebook.  Keeping a  regular schedule can help memory.  5.    Use medication organizer with sections for each day or morning/evening pills.  You may need help loading it  6.    Keep a basket, or pegboard by the door.  Place items that you need to take out with you in the basket or on the pegboard.  You may also want to  include a message board for reminders.  7.    Use sticky notes.  Place sticky notes with reminders in a place where the task is performed.  For example: " turn off the  stove" placed by the  stove, "lock the door" placed on the door at eye level, " take your medications" on  the bathroom mirror or by the place where you normally take your medications.  8.    Use alarms/timers.  Use while cooking to remind yourself to check on food or as a reminder to take your medicine, or as a  reminder to make a call, or as a reminder to perform another task, etc.

## 2018-01-29 NOTE — Progress Notes (Signed)
[facility] [address] [place]. [place] [address] [phone]       OFFICE FOLLOW-UP NOTE  [name] Date of Birth:  01-31-1991 Medical Record Number:  [id number]   HPI: Initial visit 01/05/2017 ; [name] is a 25 year pleasant African-American male seen today for first office follow-up visit following hospital admission for stroke in June 2018. Manuel Boyer is an 27 y.o. male with no significant past medical history. Approximately 5 days prior to admission on 11/28/16 patient noticed some neck discomfort with slurred speech and some right arm weakness. Apparently last night at 2000 hrs. patient was noted it worse and has significant word finding difficulties and right arm weakness. 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 There is also noted hypodensity in the left frontal lobe most compatible with a subacute infarct. The occlusion the left carotid internal artery with a taper lumen as noted above was consistent with a dissection. 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 As far as familypatient does not have sickle cell. 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 Due to the multiple arterial abnormalities secondary to moyamoya and the leptomeningeal collaterals patient is going to be sent to interventional radiology to get a conventional angiogram to further evaluate if this isn't M1 occlusion and if this is possible to intervene on. Date last known well: Date: 11/23/2016 Time last known well: Time: 20:00 tPA Given: No: out of window.. Diagnostic labs were collected and vascular disease, lupus, sickle cell, vasculitic disorders and hypercarbia states were all negative. LDL cholesterol was 104mg  percent. Hb A1c was 5.7. Transthoraxic echo showed normal ejection  fraction. Patient did give a history of habitual neck popping about to 3 times a day. He denied any significant neck injury or chiropractic ventilation in the weeks or months prior to his presentation. Cerebral catheter angiogram performed by Dr. showed occlusion of the terminal right internal carotid artery with dilated leptomeningeal collaterals. The left internal carotid artery was occluded close to its origin with a flame-shaped appearance. Patient was started on dual antiplatelet therapy aspirin and Plavix and on statin for his lipids. He states his done well since discharge. He has not had any problems with bleeding or bruising. His blood pressure is well controlled and today it is [id number]. He is getting outpatient physical occupational speech therapy. His speech is improved but he still has to struggle to find words in complete sentences. Is also noticed diminished fine motor skills in his hand as well as some intermittent tingling and numbness as well. 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 The patient is also getting a second opinion at [facility] for his moyamoya and carotid dissection and has an appointment to see Dr. [facility] in the next few weeks. Update 04/10/2017 ; he returns for follow-up after last visit with me 3 months ago.  He states he is doing well he still has some word finding difficulties and speech difficulties but these appear to be improving.  He has had no recurrent stroke or TIA symptoms.  Is tolerating aspirin well without bruising and bleeding.  For unclear reasons he seems to have stopped the Pravachol.  He tells me that his primary physician  asked him to do so but when I reviewed the primary physician Notes I clearly did not see any mention of this.  I have counseled the patient and advised him to start taking Pravachol again.  He was seen on consultation by Dr. Alinda Money vascular neurosurgeon at [facility] on 10/3/18who did a cerebral catheter angiogram on 03/30/17 which confirmed bilateral left greater than right changes of moyamoya and has recommended dural encephalo synangiosis surgery which is scheduled for January 9.. I have reviewed these records in care everywhere.  He has no new complaints today. Update 07/31/2017 : Returns for follow-up after last visit with me the months ago. He is accompanied by his mother. He underwent left encephaloduroarteriosynangiosis by Dr. Kennis Carina at [facility] on 06/13/17. I have reviewed these records in care everywhere The surgery went well. He had some transient worsening of his speech difficulties following surgery but that seems to be improving. He still has some hesitant speech with word hesitation and occasional word finding difficulties. His right grip strength is improving but he is to have some weakness. He remains on aspirin which is tolerating well without bleeding or bruising. States his blood pressure is well controlled and today it is 110/84. He remains some protocol which is tolerating well. He has scheduled follow-up visit at [facility] in a few months.  Updated 01/29/2018 :he returns for follow-up after last visit 6 months ago.He's had no further stroke or TIA symptoms. He continues to tolerate aspirin well with only minor bruising and no bleeding. He is tolerating Pravachol well without muscle aches and pains. He continues to complain of mild short-term memory difficulties. He has trouble remembering things even if he tries to write it down. His facility has been denied twice. He and his family wonders if he may benefit with referral to neuropsych testing. Patient was seen by neurosurgeon Dr. Alinda Money at [facility] on 08/10/17 and is not to follow for a year. He continues to have only occasional speech difficulties particularly when he is tired. No new complaints.    ROS:   14 system review of systems is positive for mild speech  difficulties,and memory loss only and all other systems negative  PMH:  Past Medical History:  Diagnosis Date  . Internal carotid artery dissection (HCC)   . Moya moya disease   . Stroke [facility])     Social History:  Social History   Socioeconomic History  . Marital status: Married    Spouse name: Not on file  . Number of children: Not on file  . Years of education: Not on file  . Highest education level: Not on file  Occupational History  . Not on file  Social Needs  . Financial resource strain: Not on file  . Food insecurity:    Worry: Not on file    Inability: Not on file  . Transportation needs:    Medical: Not on file    Non-medical: Not on file  Tobacco Use  . Smoking status: Former Smoker    Packs/day: 0.00    Types: Cigars  . Smokeless tobacco: Never Used  . Tobacco comment: black mild smokes 3 per month  Substance and Sexual Activity  . Alcohol use: No  . Drug use: Yes    Types: Marijuana    Comment: last took June 2018  . Sexual activity: Not on file  Lifestyle  . Physical activity:    Days per week: Not on file    Minutes per session: Not  on file  . Stress: Not on file  Relationships  . Social connections:    Talks on phone: Not on file    Gets together: Not on file    Attends religious service: Not on file    Active member of club or organization: Not on file    Attends meetings of clubs or organizations: Not on file    Relationship status: Not on file  . Intimate partner violence:    Fear of current or ex partner: Not on file    Emotionally abused: Not on file    Physically abused: Not on file    Forced sexual activity: Not on file  Other Topics Concern  . Not on file  Social History Narrative  . Not on file    Medications:   Current Outpatient Medications on File Prior to Visit  Medication Sig Dispense Refill  . aspirin EC 81 MG tablet Take by mouth.    . loratadine (CLARITIN) 10 MG tablet Take by mouth.    . pravastatin (PRAVACHOL) 20  MG tablet Take 1 tablet (20 mg total) daily by mouth. 30 tablet 3   No current facility-administered medications on file prior to visit.     Allergies:   Allergies  Allergen Reactions  . Uncaria Tomentosa (Cats Claw) Other (See Comments)    snezzing snezzing   . Molds & Smuts Itching    Physical Exam General: obese young African-American male, seated, in no evident distress Head: head normocephalic and atraumatic.  Neck: supple with no carotid or supraclavicular bruits Cardiovascular: regular rate and rhythm, no murmurs Musculoskeletal: no deformity Skin:  no rash/petichiae Vascular:  Normal pulses all extremities Vitals:   01/29/18 1347  BP: 117/72  Pulse: 85   Neurologic Exam Mental Status: Awake and fully alert. Oriented to place and time. Recent and remote memory intact. Attention span, concentration and fund of knowledge appropriate. Mood and affect appropriate. Mild expressive aphasia with slightly nonfluent speech with some word finding difficulties   Able to repeat comprehend and name well. Cranial Nerves: Fundoscopic exam not done. Pupils equal, briskly reactive to light. Extraocular movements full without nystagmus. Visual fields full to confrontation. Hearing intact. Facial sensation intact. Mild right lower facial asymmetry on smiling., tongue, palate moves normally and symmetrically.  Motor: Normal bulk and tone. Normal strength in all tested extremity muscles except mild weakness of right grip and fine finger movements in the right hand. Orbits left over right upper extremity.. Sensory.: intact to touch ,pinprick .position and vibratory sensation.  Coordination: Rapid alternating movements normal in all extremities. Finger-to-nose and heel-to-shin performed accurately bilaterally. Gait and Station: Arises from chair without difficulty. Stance is normal. Gait demonstrates normal stride length and balance . Able to heel, toe and tandem walk without difficulty.    Reflexes: 1+ and symmetric. Toes downgoing.       ASSESSMENT: [age]  year African-American male with left MCA branch infarct in June 2018 likely due to proximal cervical carotid artery dissection with occlusion and likely distal stump emboli Interesting cerebral angiogram finding suggestive of moyamoya disease with terminal right ICA occlusion with prominent leptomeningeal collaterals.s/p left encephaloduroarteriosynangiosis on 06/14/03/2019 at [facility] by Dr. Kennis CarinaErik Hauck       PLAN: I  had a long discussion with the patient and his mother whom I spoke over the phone regarding his mild memory loss and cognitive impairment following a stroke and brain surgery which is likely to be static and not progress. I encouraged him to increase  participation in cognitively challenging activities like solving crossword puzzles, playing bridge and sudoku. He also discussed memory compensation strategies. Continue aspirin for stroke prevention and strict control of lipids with LDL cholesterol goal below 70 mg percent and blood pressure goal below 130/time 0. I encouraged him to eat a healthy diet and exercise regularly and lose weight. The family wanted a neuropsych referral but I question its utility unless it will help him pursue his disability case.Return for follow-up in 6 months with minerals practitioner Shanda Bumps call earlier if necessary. Greater than 50% of time during this 35 minute visit was spent on counseling,explanation of diagnosis of carotid dissection, moyamoya disease, stroke and aphasia, planning of further management, discussion with patient and family and coordination of care Delia Heady, MD  [facility] [address] [place], [place] [address]  Phone [phone] Fax [phone] Note: This document was prepared with digital dictation and possible smart phrase technology. Any transcriptional errors that result from this process are unintentional

## 2018-03-22 IMAGING — XA IR ANGIO INTRA EXTRACRAN SEL COM CAROTID INNOMINATE BILAT MOD SE
1 series · 11 of 24 positions shown · IV contrast (IODINE)
Comparison: CT angiogram of the head and neck of 11/28/2016.

CLINICAL DATA: Five day history of speech difficulties primarily of
expression, with the right arm numbness and weakness. CT angiogram
revealing occlusion of the left internal carotid artery proximally.
Also suspicion on CT angiogram of numerous collaterals at the region
of the internal carotid artery distally.

EXAM:
BILATERAL COMMON CAROTID AND INNOMINATE ANGIOGRAPHY; IR ANGIO
VERTEBRAL SEL VERTEBRAL BILAT MOD SED
TECHNIQUE: Informed written consent was obtained from the patient after a
thorough discussion of the procedural risks, benefits and
alternatives. All questions were addressed. Maximal Sterile Barrier
Technique was utilized including caps, mask, sterile gowns, sterile
gloves, sterile drape, hand hygiene and skin antiseptic. A timeout
was performed prior to the initiation of the procedure.

[Series 300: dr. (person_name) · 11 of 209 slices shown]
[im 10/209]
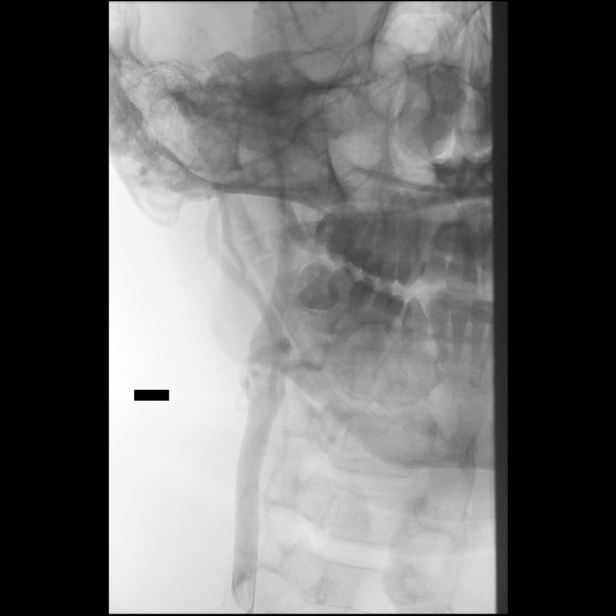
[im 28/209]
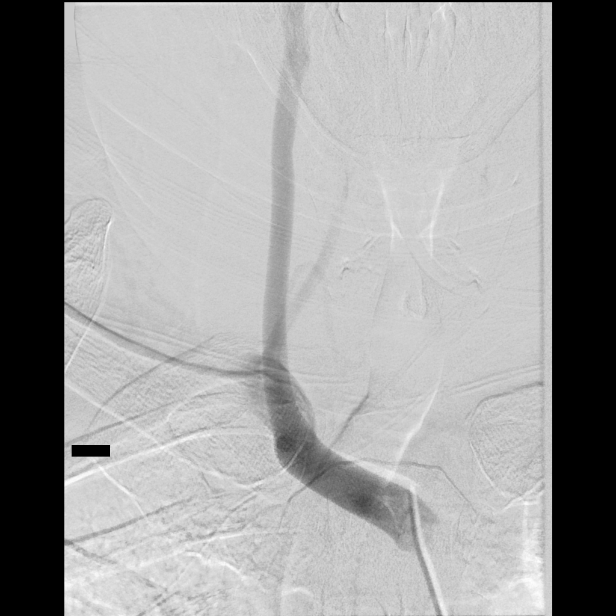
[im 46/209]
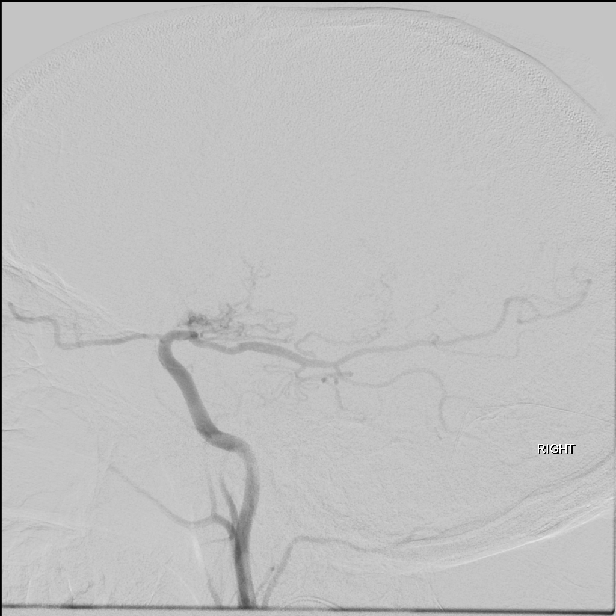
[im 64/209]
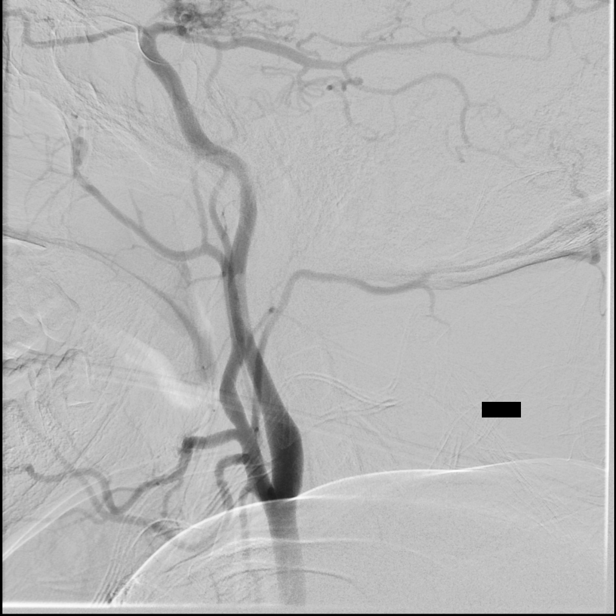
[im 82/209]
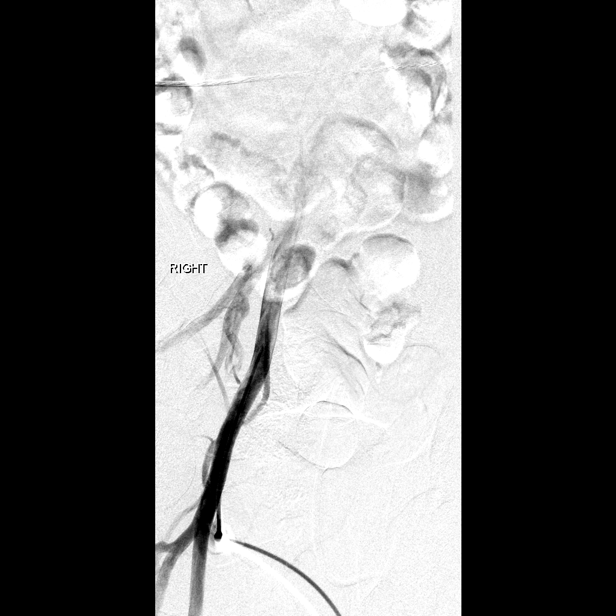
[im 109/209]
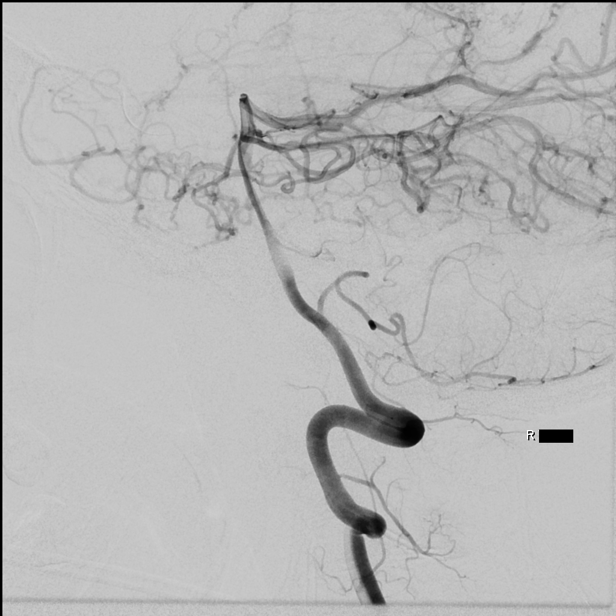
[im 127/209]
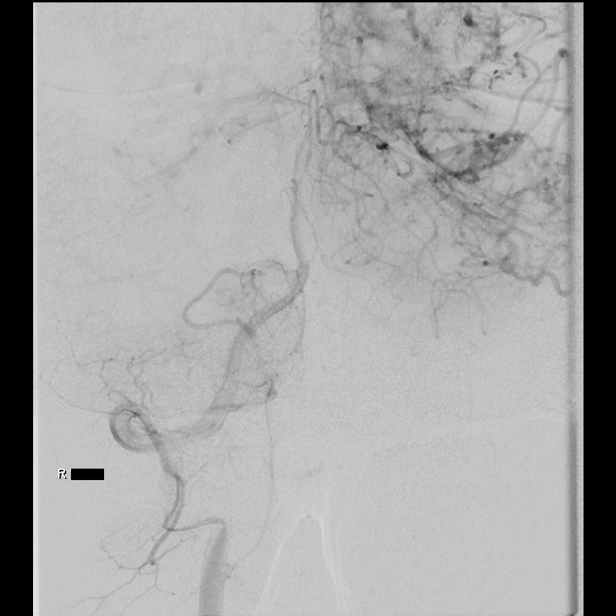
[im 145/209]
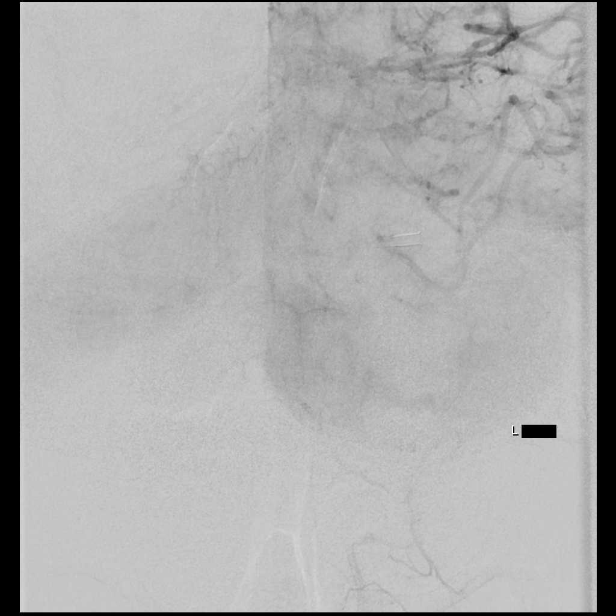
[im 163/209]
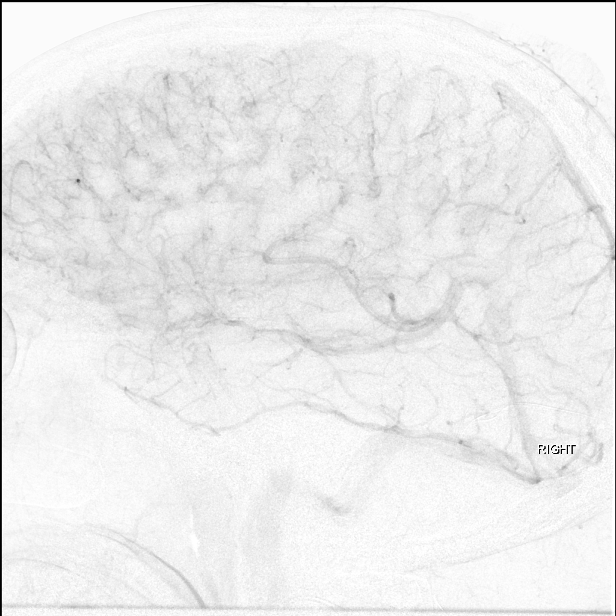
[im 181/209]
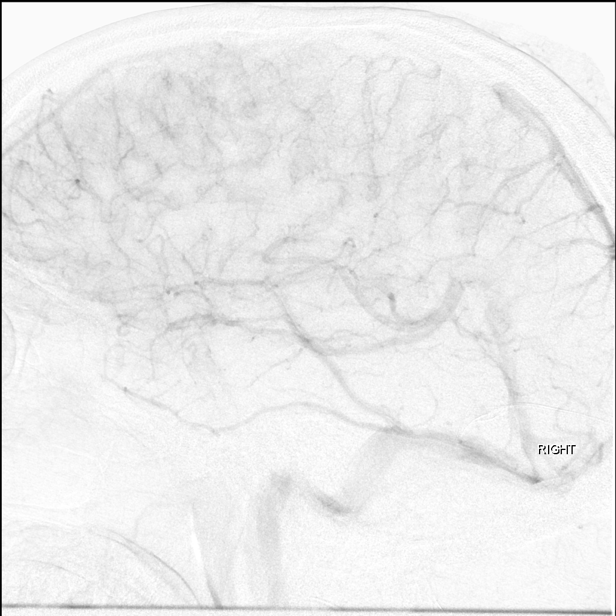
[im 199/209]
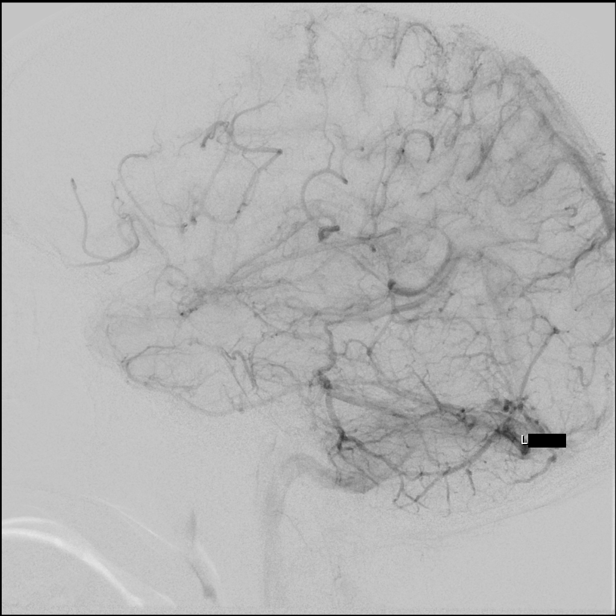

[11 of 24 positions shown; findings below may reference images not displayed]

MEDICATIONS:
Heparin 6888 units IV; No antibiotic was administered within 1 hour
of the procedure.

ANESTHESIA/SEDATION:
Mac anesthesia as per general anesthesia service.

CONTRAST:  Isovue 300 approximately 65 mL.

FLUOROSCOPY TIME:  Fluoroscopy Time: 6 minutes 48 seconds (2199
mGy).

COMPLICATIONS:
None immediate.
The right groin was prepped and draped in the usual sterile fashion.
Thereafter using modified Seldinger technique, transfemoral access
into the right common femoral artery was obtained without
difficulty. Over a 0.035 inch guidewire, a 5 French Pinnacle sheath
was inserted. Through this, and also over 0.035 inch guidewire, a 5
French JB 1 catheter was advanced to the aortic arch region and
selectively positioned in the right common carotid artery, , the
right vertebral artery, the left common carotid artery and the left
vertebral artery.
FINDINGS: The right vertebral artery origin is normal.

The vessel is seen to opacify normally to the cranial skull base.
Wide patency is seen of the right vertebrobasilar junction and the
right posterior-inferior cerebellar artery.

Also noted is wide patency of the basilar artery, the posterior
cerebral arteries, the superior cerebellar arteries and the
anterior-inferior cerebellar arteries into the capillary and venous
phases.

The whole head DSA in the AP and lateral views demonstrates
extensive numerous collaterals arising from the posterior
communicating artery, the P1 region and the P3 regions of the
posterior cerebral arteries more prominent on the left side.

There is prompt filling of the posterior temporal branch, anterior
temporal branch, the parietal occipital branches and also
retrogradely of the left pericallosal and callosal marginal artery
to the level of the anterior [DATE] of the corpus callosum.

Non-opacified blood is seen in the basilar artery from the
contralateral left vertebral artery.

The right common carotid arteriogram demonstrates the right external
carotid artery and its major branches to be widely patent.

The right internal carotid artery at the bulb to the cranial skull
base opacifies normally.

The petrous and the proximal cavernous segments are widely patent.

There is complete angiographic occlusion of the distal cavernous
portion of the right internal carotid artery just distal to the
origin of the ophthalmic artery. There is an abnormally prominent
posterior communicating artery supplying the posterior cerebral
artery into the capillary and venous phases.

Arising just distal to the occluded right internal carotid artery
are numerous hair thin blood vessels which reconstitute the anterior
choroidal artery with subsequent opacification of the right anterior
cerebral A1 segment and the right A2 and the left A2 segments. An
anterior communicating artery is noted to be present.

The delayed arterial phase then demonstrates reconstitution of the
right middle cerebral artery distribution in the proximal M1 region
from these multiple collaterals. A delayed opacification of the
right MCA distribution is noted with poor opacification of the
distal attenuated perisylvian branches.

The delayed arterial phase again demonstrates extensive collaterals
arising from the P1, P2 and P3 regions of the right posterior
cerebral artery with subsequent opacification of the anterior and
posterior temporal lobes, the parietal occipital areas. The
retrograde opacification via the P3 branches of the right posterior
cerebral artery is noted of the callosal marginal and the
pericallosal arteries to the level of the head of the corpus
callosum.

The capillary phase demonstrates a near uniform homogeneous
capillary opacification of the entire right cerebral hemisphere with
subsequent opacification of the major dural venous sinuses.

The left common carotid arteriogram demonstrates the left external
carotid artery and its major branches to be widely patent.

The left internal carotid artery just at the bulb demonstrates a
flame shaped configuration stump with complete angiographic
occlusion of the vessel distal to this.

Intracranially, there is no reconstitution of the internal carotid
artery in the cavernous segments or the petrous segments from the
external carotid artery branches.

The left vertebral artery origin is mildly stenosed. This vessel is
abnormally prominent and ascends normally to the cranial skull base.

There is normal opacification of the left posterior-inferior
cerebellar artery and the left vertebrobasilar junction.

The basilar artery, the posterior cerebral arteries, the superior
cerebellar arteries and the anterior-inferior cerebellar arteries
opacify into the capillary and venous phases.

Again demonstrated are the prominent multiple hair thin collaterals
arising from the posterior cerebral artery P1 segment, P2 segment
and P3 segments with abnormally prominent lateral and medial
thalamoperforators with subsequent opacification of the left
pericallosal and the callosal marginal branches to the level of the
head of the corpus callosum.

Also demonstrated is opacification of the anterior temporal lobe and
the temporoparietal and occipital regions.

The delayed arterial phase also demonstrates retrograde
opacification of the perisylvian branches of the left middle
cerebral artery from the leptomeningeal collaterals of the P3
segment of the left posterior cerebral artery.
IMPRESSION: Angiographically occluded right internal carotid artery just distal
to the origin of the ophthalmic artery.

Angiography occluded left internal carotid artery at the bulb with a
flame shaped configuration of the stump.

There is no distal reconstitution of the left internal carotid
artery at the cranial skull base.

Extensive numerous fine arterial collaterals arising from the
posterior cerebral artery P1, P2 and P3 regions bilaterally, from
the anterior choroidal artery on the right side, with subsequent
opacification of the right and left cerebral hemispheres as
described above.

The angiographic appearance is suggestive of a Moya Moya
angiographic appearance signifying the chronicity of the occlusions
of the internal carotid arteries bilaterally.

Angiographic appearance of the left internal carotid artery
proximally may suggest underlying pathology of recent onset, such as
a dissection, which may have contributed to the patient's recent
neurologic symptomatology.

PLAN:
As per primary physicians.

## 2018-08-01 ENCOUNTER — Ambulatory Visit: Payer: Medicaid Other | Admitting: Adult Health

## 2018-08-05 ENCOUNTER — Encounter: Payer: Self-pay | Admitting: Adult Health

## 2018-08-27 ENCOUNTER — Telehealth (INDEPENDENT_AMBULATORY_CARE_PROVIDER_SITE_OTHER): Payer: Self-pay | Admitting: Internal Medicine

## 2018-08-27 ENCOUNTER — Other Ambulatory Visit: Payer: Self-pay

## 2018-08-27 DIAGNOSIS — I675 Moyamoya disease: Secondary | ICD-10-CM

## 2018-08-27 MED ORDER — PRAVASTATIN SODIUM 20 MG PO TABS: 20.0000 mg | ORAL_TABLET | Freq: Every day | ORAL | 3 refills | Status: DC

## 2018-08-27 NOTE — Patient Instructions (Signed)
-   come to clinic at your nearest convenience for follow up of your yearly hemoglobin A1c and lipid profile

## 2018-08-27 NOTE — Progress Notes (Signed)
This is a telephone encounter between [employer] and Manuel Boyer on 08/27/2018 for follow up of hyperlipidemia. The visit was conducted with the patient located at home and Manuel Boyer at [facility]. The patient's identity was confirmed using their DOB and current address. The patient has consented to being evaluated through a telephone encounter and understands the associated risks/benefits. I personally spent 10 on medical discussion.     CC: follow up of hyperlipidemia  HPI:  [name] is a 28 y.o. 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 Please see the assessment and plans for the status of the patient chronic medical problems.  Review of Systems:  Refer to history of present illness and assessment and plans for pertinent review of systems, all others reviewed and negative  Assessment & Plan:   Hyperlipidemia  Patient is called for follow up of hyperlipidemia. HLD was found at the time of diagnosis of stroke and Moya Moya. LDL was 104 at the time. When LDL was last checked one year ago it was improved to 74. [name] expresses that he has decided to stop taking the pravastatin because he does not feel that this medication is having a noticeable affect for him. He has continued to follow up with the [facility] at [facility] and is taking his aspirin. He denies side effects of the medication and states that it is free now that he has medicaid. I explained  hyperlipidemia and the importance of controlling cholesterol to avoid another stroke. [name] explains that he is understanding of this risk but would still prefer not to take the statin. I encouraged healthy eating and exercise.  - follow up lipid profile at lab only visits  Prediabetes  Hemoglobin A1c 5.8 when last checked one year ago. He states he is feeling well and denies symptoms of Diabetes Mellitus.  - follow up Hemoglobin A1c at lab only visit   See Encounters  Tab for problem based charting.  Patient discussed with Dr. Criselda Peaches

## 2018-08-28 NOTE — Progress Notes (Signed)
Internal Medicine Clinic Attending  Case discussed with Dr. Blum soon after the resident spoke with the patient.  We reviewed the resident's telehealth visit.  I agree with the assessment, diagnosis, and plan of care documented in the resident's note.   

## 2018-10-07 ENCOUNTER — Other Ambulatory Visit: Payer: Medicaid Other

## 2018-10-08 ENCOUNTER — Other Ambulatory Visit: Payer: Self-pay | Admitting: *Deleted

## 2018-10-08 ENCOUNTER — Telehealth: Payer: Self-pay | Admitting: *Deleted

## 2018-10-08 DIAGNOSIS — I675 Moyamoya disease: Secondary | ICD-10-CM

## 2018-10-08 NOTE — Telephone Encounter (Signed)
10-08-2018  15:05  I called [name] in reference to lab orders placed by Dr Hetty Ely 08-27-2018. He verified his DOB before continuing with conversation.  I shared with [name] the need to go to [facility] for his blood work due to the [employer] restrictions.He understood and agreed.  I shared with him the address and office hours of the [employer] location.  I have released his orders to Edsel Petrin, PBT 10-08-2018 15:13

## 2018-11-28 ENCOUNTER — Encounter: Payer: Self-pay | Admitting: *Deleted

## 2019-04-20 ENCOUNTER — Ambulatory Visit (INDEPENDENT_AMBULATORY_CARE_PROVIDER_SITE_OTHER)
Admission: RE | Admit: 2019-04-20 | Discharge: 2019-04-20 | Disposition: A | Discharge status: Home / Self Care | Payer: Medicaid Other | Source: Ambulatory Visit

## 2019-04-20 DIAGNOSIS — B009 Herpesviral infection, unspecified: Secondary | ICD-10-CM

## 2019-04-20 MED ORDER — VALACYCLOVIR HCL 1 G PO TABS: 1000.0000 mg | ORAL_TABLET | Freq: Three times a day (TID) | ORAL | 0 refills | Status: AC

## 2019-04-20 NOTE — Discharge Instructions (Signed)
Valtrex refilled.

## 2019-04-20 NOTE — ED Provider Notes (Signed)
Virtual Visit via Video Note:  Manuel Boyer  initiated request for Telemedicine visit with [facility] team. I connected with Manuel Boyer  on 04/20/2019 at 2:03 PM  for a synchronized telemedicine visit using a video enabled HIPPA compliant telemedicine application. I verified that I am speaking with Manuel Boyer  using two identifiers. Manuel Boyer, Manuel Boyer  was physically located in a [facility] Urgent care site and Manuel Boyer was located at a different location.   The limitations of evaluation and management by telemedicine as well as the availability of in-person appointments were discussed. Patient was informed that he  may incur a bill ( including co-pay) for this virtual visit encounter. Manuel Boyer  expressed understanding and gave verbal consent to proceed with virtual visit.     History of Present Illness:Manuel Boyer  is a 28 y.o. male presents for evaluation of medication refill.  Patient states that he has had a herpes outbreak.  Outbreak is in his genital area.  Feels very similar to previous outbreaks.  Typically takes Valtrex.  Is not on suppression therapy.  Denies any discharge or dysuria.     Allergies  Allergen Reactions  . Uncaria Tomentosa (Cats Claw) Other (See Comments)    snezzing snezzing   . Molds & Smuts Itching     Past Medical History:  Diagnosis Date  . 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 Moya moya disease   . Stroke [facility])      Social History   Tobacco Use  . Smoking status: Former Smoker    Packs/day: 0.00    Types: Cigars  . Smokeless tobacco: Never Used  . Tobacco comment: black mild smokes 3 per month  Substance Use Topics  . Alcohol use: No  . Drug use: Yes    Types: Marijuana    Comment: last took June 2018        Observations/Objective: Physical Exam  Constitutional: He is oriented to person, place, and time and well-developed, well-nourished, and in no distress. No distress.  HENT:   Head: Normocephalic and atraumatic.  Eyes: Pupils are equal, round, and reactive to light.  Neck: Normal range of motion.  Pulmonary/Chest: Effort normal. No respiratory distress.  Speaking in full sentences  Neurological: He is alert and oriented to person, place, and time.  Speech clear, face symmetric     Assessment and Plan:    ICD-10-CM   1. HSV (herpes simplex virus) infection  B00.9    Refilled Valtrex to begin taking 3 times daily x10 days.  Advised to follow-up if rash ever not seeming similar to typical HSV outbreak.    Follow Up Instructions:     I discussed the assessment and treatment plan with the patient. The patient was provided an opportunity to ask questions and all were answered. The patient agreed with the plan and demonstrated an understanding of the instructions.   The patient was advised to call back or seek an in-person evaluation if the symptoms worsen or if the condition fails to improve as anticipated.      Manuel Boyer, Manuel Boyer  04/20/2019 2:03 PM         Manuel Boyer, Manuel Boyer 04/20/19 1403

## 2019-07-28 ENCOUNTER — Telehealth: Payer: Self-pay | Admitting: Neurology

## 2019-07-28 NOTE — Telephone Encounter (Signed)
Appointment Request From: Manuel Boyer  With Provider: Delia Heady, MD [[facility]]  Preferred Date Range: 06/23/2019 - 10/03/2019  Preferred Times: Monday Afternoon, Tuesday Afternoon, Wednesday Afternoon, Thursday Afternoon, Friday Afternoon  Reason for visit: Request an Appointment  Comments: N/A   Received appointment request from patient. Called patient and LVM regarding scheduling an appointment. Can see Shanda Bumps NP.

## 2019-10-02 ENCOUNTER — Ambulatory Visit: Payer: Medicaid Other | Attending: Family

## 2019-10-02 DIAGNOSIS — Z23 Encounter for immunization: Secondary | ICD-10-CM

## 2019-10-02 NOTE — Progress Notes (Signed)
   Covid-19 Vaccination Clinic  Name:  Absalom Aro    MRN: [id number] DOB: [birth year]  10/02/2019  Mr. Raver was observed post Covid-19 immunization for 15 minutes without incident. He was provided with Vaccine Information Sheet and instruction to access the V-Safe system.   Mr. Thibeau was instructed to call 911 with any severe reactions post vaccine: Marland Kitchen Difficulty breathing  . Swelling of face and throat  . A fast heartbeat  . A bad rash all over body  . Dizziness and weakness   Immunizations Administered    Name Date Dose VIS Date Route   Moderna COVID-19 Vaccine 10/02/2019  4:17 PM 0.5 mL 05/2019 Intramuscular   Manufacturer: Moderna   Lot: [id number]   NDC: 75300-511-02

## 2019-10-10 ENCOUNTER — Encounter: Payer: Self-pay | Admitting: *Deleted

## 2019-10-30 ENCOUNTER — Ambulatory Visit: Payer: Medicaid Other | Attending: Family

## 2019-10-30 DIAGNOSIS — Z23 Encounter for immunization: Secondary | ICD-10-CM

## 2019-10-30 NOTE — Progress Notes (Signed)
   Covid-19 Vaccination Clinic  Name:  Manuel Boyer    MRN: [id number] DOB: 1991-05-06  10/30/2019  Mr. Siler was observed post Covid-19 immunization for 15 minutes without incident. He was provided with Vaccine Information Sheet and instruction to access the V-Safe system.   Mr. Drudge was instructed to call 911 with any severe reactions post vaccine: Marland Kitchen Difficulty breathing  . Swelling of face and throat  . A fast heartbeat  . A bad rash all over body  . Dizziness and weakness   Immunizations Administered    Name Date Dose VIS Date Route   Moderna COVID-19 Vaccine 10/30/2019  4:13 PM 0.5 mL 05/2019 Intramuscular   Manufacturer: Moderna   Lot: [id number]   NDC: 65537-482-70

## 2020-09-13 ENCOUNTER — Other Ambulatory Visit: Payer: Self-pay

## 2020-09-13 ENCOUNTER — Ambulatory Visit (INDEPENDENT_AMBULATORY_CARE_PROVIDER_SITE_OTHER): Payer: 59 | Admitting: Internal Medicine

## 2020-09-13 ENCOUNTER — Encounter: Payer: Self-pay | Admitting: Internal Medicine

## 2020-09-13 VITALS — BP 124/78 | HR 92 | Temp 98.2°F | Ht 71.0 in | Wt 260.0 lb

## 2020-09-13 DIAGNOSIS — E785 Hyperlipidemia, unspecified: Secondary | ICD-10-CM | POA: Diagnosis not present

## 2020-09-13 DIAGNOSIS — R739 Hyperglycemia, unspecified: Secondary | ICD-10-CM | POA: Diagnosis not present

## 2020-09-13 DIAGNOSIS — I675 Moyamoya disease: Secondary | ICD-10-CM | POA: Diagnosis not present

## 2020-09-13 DIAGNOSIS — Z0000 Encounter for general adult medical examination without abnormal findings: Secondary | ICD-10-CM | POA: Diagnosis not present

## 2020-09-13 LAB — HEPATIC FUNCTION PANEL
ALT: 31 U/L (ref 0–53)
AST: 14 U/L (ref 0–37)
Albumin: 4.1 g/dL (ref 3.5–5.2)
Alkaline Phosphatase: 75 U/L (ref 39–117)
Bilirubin, Direct: 0.1 mg/dL (ref 0.0–0.3)
Total Bilirubin: 0.4 mg/dL (ref 0.2–1.2)
Total Protein: 6.8 g/dL (ref 6.0–8.3)

## 2020-09-13 LAB — CBC WITH DIFFERENTIAL/PLATELET
Basophils Absolute: 0 10*3/uL (ref 0.0–0.1)
Basophils Relative: 0.6 % (ref 0.0–3.0)
Eosinophils Absolute: 0 10*3/uL (ref 0.0–0.7)
Eosinophils Relative: 0.3 % (ref 0.0–5.0)
HCT: 41.2 % (ref 39.0–52.0)
Hemoglobin: 14 g/dL (ref 13.0–17.0)
Lymphocytes Relative: 34.5 % (ref 12.0–46.0)
Lymphs Abs: 2.4 10*3/uL (ref 0.7–4.0)
MCHC: 34 g/dL (ref 30.0–36.0)
MCV: 90 fl (ref 78.0–100.0)
Monocytes Absolute: 0.4 10*3/uL (ref 0.1–1.0)
Monocytes Relative: 5.2 % (ref 3.0–12.0)
Neutro Abs: 4.1 10*3/uL (ref 1.4–7.7)
Neutrophils Relative %: 59.4 % (ref 43.0–77.0)
Platelets: 263 10*3/uL (ref 150.0–400.0)
RBC: 4.58 Mil/uL (ref 4.22–5.81)
RDW: 14.6 % (ref 11.5–15.5)
WBC: 7 10*3/uL (ref 4.0–10.5)

## 2020-09-13 LAB — BASIC METABOLIC PANEL
BUN: 10 mg/dL (ref 6–23)
CO2: 26 mEq/L (ref 19–32)
Calcium: 9.1 mg/dL (ref 8.4–10.5)
Chloride: 105 mEq/L (ref 96–112)
Creatinine, Ser: 1.1 mg/dL (ref 0.40–1.50)
GFR: 89.16 mL/min (ref >60.00)
Glucose, Bld: 83 mg/dL (ref 70–99)
Potassium: 3.7 mEq/L (ref 3.5–5.1)
Sodium: 138 mEq/L (ref 135–145)

## 2020-09-13 LAB — HEMOGLOBIN A1C: Hgb A1c MFr Bld: 6.1 % (ref 4.6–6.5)

## 2020-09-13 LAB — LIPID PANEL
Cholesterol: 157 mg/dL (ref 0–200)
HDL: 32.4 mg/dL — ABNORMAL LOW (ref >39.00)
LDL Cholesterol: 100 mg/dL — ABNORMAL HIGH (ref 0–99)
NonHDL: 125.01
Total CHOL/HDL Ratio: 5
Triglycerides: 125 mg/dL (ref 0.0–149.0)
VLDL: 25 mg/dL (ref 0.0–40.0)

## 2020-09-13 LAB — TSH: TSH: 0.61 u[IU]/mL (ref 0.35–4.50)

## 2020-09-13 MED ORDER — PRAVASTATIN SODIUM 20 MG PO TABS: 20.0000 mg | ORAL_TABLET | Freq: Every day | ORAL | 3 refills | Status: DC

## 2020-09-13 MED ORDER — ASPIRIN EC 81 MG PO TBEC
81.0000 mg | DELAYED_RELEASE_TABLET | Freq: Every day | ORAL | 1 refills | Status: DC
Start: 2020-09-13 — End: 2021-08-21

## 2020-09-13 NOTE — Progress Notes (Signed)
Subjective:  Patient ID: Manuel Boyer, male    DOB: July 05, 1990  Age: 30 y.o. MRN: [id number]  CC: Annual Exam and Hyperlipidemia  This visit occurred during the SARS-CoV-2 public health emergency.  Safety protocols were in place, including screening questions prior to the visit, additional usage of staff PPE, and extensive cleaning of exam room while observing appropriate contact time as indicated for disinfecting solutions.    HPI Firman Hamberger presents for a CPX and to establish.  He has a history of CVA and moyamoya disease.  He is status post CNS bypass.  He is followed by a local neurologist.  He continues to have mild speech difficulty and word finding difficulty but otherwise has made a good recovery.  He denies difficulty walking or paresthesias.  Outpatient Medications Prior to Visit  Medication Sig Dispense Refill  . loratadine (CLARITIN) 10 MG tablet Take by mouth.    Marland Kitchen aspirin EC 81 MG tablet Take by mouth.    . pravastatin (PRAVACHOL) 20 MG tablet Take 1 tablet (20 mg total) by mouth daily. 90 tablet 3   No facility-administered medications prior to visit.    ROS Review of Systems  Constitutional: Negative.  Negative for diaphoresis and fatigue.  HENT: Negative.  Negative for trouble swallowing.   Respiratory: Negative for cough, chest tightness, shortness of breath and wheezing.   Cardiovascular: Negative for chest pain, palpitations and leg swelling.  Gastrointestinal: Negative for abdominal pain, constipation, diarrhea and vomiting.  Endocrine: Negative.   Genitourinary: Negative.  Negative for difficulty urinating, penile swelling and scrotal swelling.  Musculoskeletal: Negative.  Negative for arthralgias and gait problem.  Skin: Negative.  Negative for color change and pallor.  Neurological: Positive for speech difficulty. Negative for dizziness, weakness, light-headedness, numbness and headaches.  Hematological: Negative for adenopathy. Does not  bruise/bleed easily.  Psychiatric/Behavioral: Negative.     Objective:  BP 124/78   Pulse 92   Temp 98.2 F (36.8 C) (Oral)   Ht 5\' 11"  (1.803 m)   Wt 260 lb (117.9 kg)   SpO2 96%   BMI 36.26 kg/m   BP Readings from Last 3 Encounters:  09/13/20 124/78  01/29/18 117/72  07/31/17 110/87    Wt Readings from Last 3 Encounters:  09/13/20 260 lb (117.9 kg)  01/29/18 249 lb 12.8 oz (113.3 kg)  07/31/17 265 lb 6.4 oz (120.4 kg)    Physical Exam Vitals reviewed.  HENT:     Nose: Nose normal.     Mouth/Throat:     Mouth: Mucous membranes are moist.  Eyes:     General: No scleral icterus.    Pupils: Pupils are equal, round, and reactive to light.  Cardiovascular:     Rate and Rhythm: Normal rate and regular rhythm.     Heart sounds: No murmur heard.   Pulmonary:     Effort: Pulmonary effort is normal.     Breath sounds: No stridor. No wheezing, rhonchi or rales.  Abdominal:     General: Abdomen is flat. Bowel sounds are normal. There is no distension.     Palpations: Abdomen is soft. There is no hepatomegaly, splenomegaly or mass.     Tenderness: There is no abdominal tenderness.  Musculoskeletal:        General: Normal range of motion.     Cervical back: Neck supple.     Right lower leg: No edema.     Left lower leg: No edema.  Lymphadenopathy:     Cervical: No cervical  adenopathy.  Skin:    General: Skin is warm and dry.     Coloration: Skin is not pale.  Neurological:     General: No focal deficit present.     Mental Status: He is alert.  Psychiatric:        Mood and Affect: Mood normal.        Behavior: Behavior normal.     Lab Results  Component Value Date   WBC 7.0 09/13/2020   HGB 14.0 09/13/2020   HCT 41.2 09/13/2020   PLT 263.0 09/13/2020   GLUCOSE 83 09/13/2020   CHOL 157 09/13/2020   TRIG 125.0 09/13/2020   HDL 32.40 (L) 09/13/2020   LDLCALC 100 (H) 09/13/2020   ALT 31 09/13/2020   AST 14 09/13/2020   NA 138 09/13/2020   K 3.7  09/13/2020   CL 105 09/13/2020   CREATININE 1.10 09/13/2020   BUN 10 09/13/2020   CO2 26 09/13/2020   TSH 0.61 09/13/2020   INR 1.00 11/28/2016   HGBA1C 6.1 09/13/2020    No results found.  Assessment & Plan:   Khasir was seen today for annual exam and hyperlipidemia.  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 -     aspirin EC 81 MG tablet; Take 1 tablet (81 mg total) by mouth daily. -     pravastatin (PRAVACHOL) 20 MG tablet; Take 1 tablet (20 mg total) by mouth daily. -     CBC with Differential/Platelet; Future -     CBC with Differential/Platelet -     Ambulatory referral to Neurology  Routine general medical examination at a health care facility- Exam completed, labs reviewed, vaccines reviewed, patient education was given. -     Lipid panel; Future -     Lipid panel  Hyperlipidemia with target LDL less than 100- He has achieved his LDL goal and is doing well on the statin. -     pravastatin (PRAVACHOL) 20 MG tablet; Take 1 tablet (20 mg total) by mouth daily. -     Hepatic function panel; Future -     TSH; Future -     TSH -     Hepatic function panel  Hyperglycemia- He is prediabetic with an A1c of 6.1%.  Medical therapy is not indicated. -     Basic metabolic panel; Future -     Hemoglobin A1c; Future -     Hemoglobin A1c -     Basic metabolic panel   I have changed Deroy Double's aspirin EC. I am also having him maintain his loratadine and pravastatin.  Meds ordered this encounter  Medications  . aspirin EC 81 MG tablet    Sig: Take 1 tablet (81 mg total) by mouth daily.    Dispense:  90 tablet    Refill:  1  . pravastatin (PRAVACHOL) 20 MG tablet    Sig: Take 1 tablet (20 mg total) by mouth daily.    Dispense:  90 tablet    Refill:  3     Follow-up: Return in about 1 year (around 09/13/2021).  Sanda Linger, MD

## 2020-09-13 NOTE — Patient Instructions (Addendum)

## 2020-09-18 ENCOUNTER — Encounter: Payer: Self-pay | Admitting: Internal Medicine

## 2021-01-13 ENCOUNTER — Ambulatory Visit: Payer: Medicaid Other | Admitting: Neurology

## 2021-03-07 ENCOUNTER — Ambulatory Visit (INDEPENDENT_AMBULATORY_CARE_PROVIDER_SITE_OTHER): Payer: 59 | Admitting: Neurology

## 2021-03-07 ENCOUNTER — Other Ambulatory Visit: Payer: Self-pay

## 2021-03-07 ENCOUNTER — Encounter: Payer: Self-pay | Admitting: Neurology

## 2021-03-07 VITALS — BP 112/63 | HR 71 | Ht 70.0 in | Wt 253.0 lb

## 2021-03-07 DIAGNOSIS — E669 Obesity, unspecified: Secondary | ICD-10-CM

## 2021-03-07 DIAGNOSIS — Z8673 Personal history of transient ischemic attack (TIA), and cerebral infarction without residual deficits: Secondary | ICD-10-CM | POA: Diagnosis not present

## 2021-03-07 DIAGNOSIS — Z9189 Other specified personal risk factors, not elsewhere classified: Secondary | ICD-10-CM

## 2021-03-07 DIAGNOSIS — I675 Moyamoya disease: Secondary | ICD-10-CM

## 2021-03-07 NOTE — Patient Instructions (Signed)
I had a long d/w patient and his wife about his remote  stroke, moyamoya disease, risk for recurrent stroke/TIAs, personally independently reviewed imaging studies and stroke evaluation results and answered questions.Continue aspirin 81 mg daily  for secondary stroke prevention and maintain strict control of hypertension with blood pressure goal below 130/90, diabetes with hemoglobin A1c goal below 6.5% and lipids with LDL cholesterol goal below 70 mg/dL. I also advised the patient to eat a healthy diet with plenty of whole grains, cereals, fruits and vegetables, exercise regularly and maintain ideal body weight .recommend check lipid profile, hemoglobin A1c and polysomnogram for sleep apnea.  Continue follow-up with [facility] for his moyamoya treatment.  Followup in the future with me in 6 months or call earlier if necessary.

## 2021-03-07 NOTE — Progress Notes (Signed)
[facility] [address] [place]. [place] [address] [phone]       OFFICE FOLLOW-UP NOTE  [name] Date of Birth:  01-31-1991 Medical Record Number:  [id number]   HPI: Initial visit 01/05/2017 ; [name] is a 25 year pleasant African-American male seen today for first office follow-up visit following hospital admission for stroke in June 2018. Manuel Boyer is an 30 y.o. male with no significant past medical history. Approximately 5 days prior to admission on 11/28/16 patient noticed some neck discomfort with slurred speech and some right arm weakness. Apparently last night at 2000 hrs. patient was noted it worse and has significant word finding difficulties and right arm weakness. 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 There is also noted hypodensity in the left frontal lobe most compatible with a subacute infarct. The occlusion the left carotid internal artery with a taper lumen as noted above was consistent with a dissection. 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 As far as familypatient does not have sickle cell. 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 Due to the multiple arterial abnormalities secondary to moyamoya and the leptomeningeal collaterals patient is going to be sent to interventional radiology to get a conventional angiogram to further evaluate if this isn't M1 occlusion and if this is possible to intervene on. Date last known well: Date: 11/23/2016 Time last known well: Time: 20:00 tPA Given: No: out of window.. Diagnostic labs were collected and vascular disease, lupus, sickle cell, vasculitic disorders and hypercarbia states were all negative. LDL cholesterol was 104mg  percent. Hb A1c was 5.7. Transthoraxic echo showed normal ejection  fraction. Patient did give a history of habitual neck popping about to 3 times a day. He denied any significant neck injury or chiropractic ventilation in the weeks or months prior to his presentation. Cerebral catheter angiogram performed by Dr. showed occlusion of the terminal right internal carotid artery with dilated leptomeningeal collaterals. The left internal carotid artery was occluded close to its origin with a flame-shaped appearance. Patient was started on dual antiplatelet therapy aspirin and Plavix and on statin for his lipids. He states his done well since discharge. He has not had any problems with bleeding or bruising. His blood pressure is well controlled and today it is [id number]. He is getting outpatient physical occupational speech therapy. His speech is improved but he still has to struggle to find words in complete sentences. Is also noticed diminished fine motor skills in his hand as well as some intermittent tingling and numbness as well. 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 The patient is also getting a second opinion at [facility] for his moyamoya and carotid dissection and has an appointment to see Dr. [facility] in the next few weeks. Update 04/10/2017 ; he returns for follow-up after last visit with me 3 months ago.  He states he is doing well he still has some word finding difficulties and speech difficulties but these appear to be improving.  He has had no recurrent stroke or TIA symptoms.  Is tolerating aspirin well without bruising and bleeding.  For unclear reasons he seems to have stopped the Pravachol.  He tells me that his primary physician  asked him to do so but when I reviewed the primary physician Notes I clearly did not see any mention of this.  I have counseled the patient and advised him to start taking Pravachol again.  He was seen on consultation by Dr. Alinda Money vascular neurosurgeon at [facility] on 10/3/18who did a cerebral catheter angiogram on 03/30/17 which confirmed bilateral left greater than right changes of moyamoya and has recommended dural encephalo synangiosis surgery which is scheduled for January 9.. I have reviewed these records in care everywhere.  He has no new complaints today. Update 07/31/2017 : Returns for follow-up after last visit with me the months ago. He is accompanied by his mother. He underwent left encephaloduroarteriosynangiosis by Dr. Kennis Carina at [facility] on 06/13/17. I have reviewed these records in care everywhere The surgery went well. He had some transient worsening of his speech difficulties following surgery but that seems to be improving. He still has some hesitant speech with word hesitation and occasional word finding difficulties. His right grip strength is improving but he is to have some weakness. He remains on aspirin which is tolerating well without bleeding or bruising. States his blood pressure is well controlled and today it is 110/84. He remains some protocol which is tolerating well. He has scheduled follow-up visit at [facility] in a few months.  Updated 01/29/2018 :he returns for follow-up after last visit 6 months ago.He's had no further stroke or TIA symptoms. He continues to tolerate aspirin well with only minor bruising and no bleeding. He is tolerating Pravachol well without muscle aches and pains. He continues to complain of mild short-term memory difficulties. He has trouble remembering things even if he tries to write it down. His facility has been denied twice. He and his family wonders if he may benefit with referral to neuropsych testing. Patient was seen by neurosurgeon Dr. Alinda Money at [facility] on 08/10/17 and is not to follow for a year. He continues to have only occasional speech difficulties particularly when he is tired. No new complaints.  Update 03/07/2021 ; he returns for follow-up after last visit nearly 3 years  ago.  Is accompanied by his wife.  He is doing well and has had no recurrent stroke or TIA symptoms.  He remains on aspirin for his tolerating well without bleeding or bruising.  His blood pressure is well controlled today it is 112/63.  He is tolerating Pravachol well without muscle aches and pains.  He had follow-up MRA of the brain done on 06/11/2019 at [facility] which shows patent left superficial temporal artery to middle cerebral artery bypass with diffuse narrowing of the left middle cerebral artery throughout..  Cerebral catheter angiogram on 06/10/2018 had shown patent both carotid arteries in the neck with diffuse severe narrowing of the terminal right internal carotid artery.  On the left side middle cerebral artery was diffusely narrow but left superficial temporal artery to middle cerebral artery bypass was patent.  Patient states that he does snore a lot at night but sleeps well.  He has never been tested for sleep apnea.  He has started driving trucks but states he is never felt sleepy while driving. ROS:   14 system review of systems is positive for mild speech difficulties,and memory loss only and all other systems negative  PMH:  Past Medical History:  Diagnosis Date   Internal carotid artery dissection (HCC)    Moya moya disease    Stroke [facility])     Social History:  Social  History   Socioeconomic History   Marital status: Married    Spouse name: Not on file   Number of children: Not on file   Years of education: Not on file   Highest education level: Not on file  Occupational History   Not on file  Tobacco Use   Smoking status: Former    Packs/day: 0.00    Types: Cigars, Cigarettes   Smokeless tobacco: Never   Tobacco comments:    black mild smokes 3 per month  Substance and Sexual Activity   Alcohol use: Yes    Alcohol/week: 6.0 standard drinks    Types: 4 Cans of beer, 2 Shots of liquor per week   Drug use: Yes    Types: Marijuana    Comment: last took June 2018    Sexual activity: Yes    Partners: Female  Other Topics Concern   Not on file  Social History Narrative   Not on file   Social Determinants of Health   Financial Resource Strain: Not on file  Food Insecurity: Not on file  Transportation Needs: Not on file  Physical Activity: Not on file  Stress: Not on file  Social Connections: Not on file  Intimate Partner Violence: Not on file    Medications:   Current Outpatient Medications on File Prior to Visit  Medication Sig Dispense Refill   aspirin EC 81 MG tablet Take 1 tablet (81 mg total) by mouth daily. 90 tablet 1   loratadine (CLARITIN) 10 MG tablet Take by mouth.     pravastatin (PRAVACHOL) 20 MG tablet Take 1 tablet (20 mg total) by mouth daily. 90 tablet 3   No current facility-administered medications on file prior to visit.    Allergies:   Allergies  Allergen Reactions   Uncaria Tomentosa (Cats Claw) Other (See Comments)    snezzing snezzing    Molds & Smuts Itching    Physical Exam General: obese young African-American male, seated, in no evident distress Head: head normocephalic and atraumatic.  Neck: supple with no carotid or supraclavicular bruits Cardiovascular: regular rate and rhythm, no murmurs Musculoskeletal: no deformity Skin:  no rash/petichiae Vascular:  Normal pulses all extremities Vitals:   03/07/21 1531  BP: 112/63  Pulse: 71   Neurologic Exam Mental Status: Awake and fully alert. Oriented to place and time. Recent and remote memory intact. Attention span, concentration and fund of knowledge appropriate. Mood and affect appropriate. Mild expressive aphasia with slightly nonfluent speech with some word finding difficulties   Able to repeat comprehend and name well. Cranial Nerves: Fundoscopic exam not done. Pupils equal, briskly reactive to light. Extraocular movements full without nystagmus. Visual fields full to confrontation. Hearing intact. Facial sensation intact. Mild right lower facial  asymmetry on smiling., tongue, palate moves normally and symmetrically.  Motor: Normal bulk and tone. Normal strength in all tested extremity muscles except mild weakness of right grip and fine finger movements in the right hand. Orbits left over right upper extremity.. Sensory.: intact to touch ,pinprick .position and vibratory sensation.  Coordination: Rapid alternating movements normal in all extremities. Finger-to-nose and heel-to-shin performed accurately bilaterally. Gait and Station: Arises from chair without difficulty. Stance is normal. Gait demonstrates normal stride length and balance . Able to heel, toe and tandem walk without difficulty.  Reflexes: 1+ and symmetric. Toes downgoing.       ASSESSMENT: 14 year African-American male with left MCA branch infarct in June 2018 likely due to proximal cervical carotid artery dissection with  occlusion and likely distal stump emboli Interesting cerebral angiogram finding suggestive of moyamoya disease with terminal right ICA occlusion with prominent leptomeningeal collaterals.s/p left encephaloduroarteriosynangiosis on 06/14/03/2019 at [facility] by Dr. Kennis Carina.  Patient is doing well without recurrent stroke or TIA symptoms.  Vascular risk factors of obesity and at risk for sleep apnea       PLAN: I had a long d/w patient and his wife about his remote  stroke, moyamoya disease, risk for recurrent stroke/TIAs, personally independently reviewed imaging studies and stroke evaluation results and answered questions.Continue aspirin 81 mg daily  for secondary stroke prevention and maintain strict control of hypertension with blood pressure goal below 130/90, diabetes with hemoglobin A1c goal below 6.5% and lipids with LDL cholesterol goal below 70 mg/dL. I also advised the patient to eat a healthy diet with plenty of whole grains, cereals, fruits and vegetables, exercise regularly and maintain ideal body weight .recommend check lipid profile, hemoglobin  A1c and polysomnogram for sleep apnea.  Continue follow-up with [facility] for his moyamoya treatment.  Followup in the future with me in 6 months or call earlier if necessary.ity unless it will help him pursue his disability case.Return for follow-up in 6 months with minerals practitioner Shanda Bumps call earlier if necessary. Greater than 50% of time during this 35 minute visit was spent on counseling,explanation of diagnosis of carotid dissection, moyamoya disease, stroke and aphasia, planning of further management, discussion with patient and family and coordination of care Delia Heady, MD  [facility] [address] [place], [place] [address]  Phone [phone] Fax [phone] Note: This document was prepared with digital dictation and possible smart phrase technology. Any transcriptional errors that result from this process are unintentional

## 2021-03-08 ENCOUNTER — Encounter: Payer: Self-pay | Admitting: *Deleted

## 2021-03-08 LAB — LIPID PANEL
Chol/HDL Ratio: 5.5 ratio — ABNORMAL HIGH (ref 0.0–5.0)
Cholesterol, Total: 159 mg/dL (ref 100–199)
HDL: 29 mg/dL — ABNORMAL LOW (ref >39)
LDL Chol Calc (NIH): 66 mg/dL (ref 0–99)
Triglycerides: 410 mg/dL — ABNORMAL HIGH (ref 0–149)
VLDL Cholesterol Cal: 64 mg/dL — ABNORMAL HIGH (ref 5–40)

## 2021-03-08 LAB — HEMOGLOBIN A1C
Est. average glucose Bld gHb Est-mCnc: 126 mg/dL
Hgb A1c MFr Bld: 6 % — ABNORMAL HIGH (ref 4.8–5.6)

## 2021-05-19 ENCOUNTER — Institutional Professional Consult (permissible substitution): Payer: 59 | Admitting: Neurology

## 2021-08-02 ENCOUNTER — Other Ambulatory Visit: Payer: Self-pay

## 2021-08-02 ENCOUNTER — Ambulatory Visit: Payer: Managed Care, Other (non HMO) | Admitting: Neurology

## 2021-08-02 VITALS — BP 113/67 | HR 66 | Ht 70.5 in | Wt 242.8 lb

## 2021-08-02 DIAGNOSIS — Z8673 Personal history of transient ischemic attack (TIA), and cerebral infarction without residual deficits: Secondary | ICD-10-CM | POA: Diagnosis not present

## 2021-08-02 DIAGNOSIS — R0681 Apnea, not elsewhere classified: Secondary | ICD-10-CM | POA: Diagnosis not present

## 2021-08-02 DIAGNOSIS — I675 Moyamoya disease: Secondary | ICD-10-CM

## 2021-08-02 DIAGNOSIS — R0683 Snoring: Secondary | ICD-10-CM

## 2021-08-02 DIAGNOSIS — E669 Obesity, unspecified: Secondary | ICD-10-CM

## 2021-08-02 NOTE — Progress Notes (Signed)
Subjective:    Patient ID: Manuel Boyer is a 31 y.o. male.  HPI    Huston Foley, MD, PhD [facility] [address], [address] [address] [place], [place] [address]  Dear Janalyn Shy,   I saw your patient, Manuel Boyer, upon your kind request in my sleep clinic today for initial consultation of his sleep disorder, in particular, concern for underlying obstructive sleep apnea.  The patient is unaccompanied today.  As you know, Manuel Boyer is a 31 year old right-handed gentleman with an underlying medical history of stroke, carotid artery dissection, moyamoya disease, and obesity, who reports snoring and witnessed apneas per wife's report, some daytime tiredness.  I reviewed your office note from 03/07/2021.  His Epworth sleepiness score is 8 out of 24, fatigue severity score is 16 out of 63.  He lives with his wife and 3 children, ages 71, 62 and 71.  He works as a Naval architect.  He has a bedtime between 8 and 9 PM and rise time around 4:30 AM.  He denies recurrent morning headaches or night to night nocturia, he is not aware of any family history of sleep apnea.  He does have a TV in his bedroom and it turns off on a timer, he does not really care for it, his wife likes to watch TV at night.  He drinks caffeine in the form of soda, about 1 bottle per day and 1 cup of coffee per day.  He drinks alcohol occasionally.  He smokes cigars, about 3/day.   His Past Medical History Is Significant For: Past Medical History:  Diagnosis Date   Internal carotid artery dissection (HCC)    Moya moya disease    Stroke (HCC)     His Past Surgical History Is Significant For: Past Surgical History:  Procedure Laterality Date   IR ANGIO INTRA EXTRACRAN SEL COM CAROTID INNOMINATE BILAT MOD SED  11/28/2016   IR ANGIO VERTEBRAL SEL VERTEBRAL BILAT MOD SED  11/28/2016   RADIOLOGY WITH ANESTHESIA N/A 11/28/2016   Procedure: RADIOLOGY WITH ANESTHESIA;  Surgeon: Julieanne Cotton, MD;   Location: [facility] OR;  Service: Radiology;  Laterality: N/A;    His Family History Is Significant For: Family History  Problem Relation Age of Onset   Breast cancer Mother    Prostate cancer Father     His Social History Is Significant For: Social History   Socioeconomic History   Marital status: Married    Spouse name: Not on file   Number of children: Not on file   Years of education: Not on file   Highest education level: Not on file  Occupational History   Not on file  Tobacco Use   Smoking status: Former    Packs/day: 0.00    Types: Cigars, Cigarettes   Smokeless tobacco: Never   Tobacco comments:    black mild smokes 3 per month  Substance and Sexual Activity   Alcohol use: Yes    Alcohol/week: 6.0 standard drinks    Types: 4 Cans of beer, 2 Shots of liquor per week   Drug use: Yes    Types: Marijuana    Comment: last took June 2018   Sexual activity: Yes    Partners: Female  Other Topics Concern   Not on file  Social History Narrative   Not on file   Social Determinants of Health   Financial Resource Strain: Not on file  Food Insecurity: Not on file  Transportation Needs: Not on file  Physical  Activity: Not on file  Stress: Not on file  Social Connections: Not on file    His Allergies Are:  Allergies  Allergen Reactions   Uncaria Tomentosa (Cats Claw) Other (See Comments)    snezzing snezzing    Molds & Smuts Itching  :   His Current Medications Are:  Outpatient Encounter Medications as of 08/02/2021  Medication Sig   aspirin EC 81 MG tablet Take 1 tablet (81 mg total) by mouth daily.   loratadine (CLARITIN) 10 MG tablet Take by mouth.   pravastatin (PRAVACHOL) 20 MG tablet Take 1 tablet (20 mg total) by mouth daily.   No facility-administered encounter medications on file as of 08/02/2021.  :   Review of Systems:  Out of a complete 14 point review of systems, all are reviewed and negative with the exception of these symptoms as listed  below:  Review of Systems  Neurological:        Snores on back. Noted witness apnea. ESS 8, FSS16. Had stroke 2018, has Moya Moya disease.    Objective:  Neurological Exam  Physical Exam Physical Examination:   Vitals:   08/02/21 1215  BP: 113/67  Pulse: 66    General Examination: The patient is a very pleasant 31 y.o. male in no acute distress. He appears well-developed and well-nourished and well groomed.   HEENT: Normocephalic, atraumatic, pupils are equal, round and reactive to light, extraocular tracking is good without limitation to gaze excursion or nystagmus noted. Hearing is grossly intact. Face is symmetric with normal facial animation. Speech is clear with minimal dysarthria noted. There is no hypophonia. There is no lip, neck/head, jaw or voice tremor. Neck is supple with full range of passive and active motion. There are no carotid bruits on auscultation. Oropharynx exam reveals: mild mouth dryness, adequate dental hygiene and moderate airway crowding secondary to small airway entry, large appearing tongue, tonsillar size of about 2+, Mallampati class III.  Neck circumference of 16 5/8 inches.  Tongue protrudes centrally and palate elevates symmetrically.  Minimal overbite.  Chest: Clear to auscultation without wheezing, rhonchi or crackles noted.  Heart: S1+S2+0, regular and normal without murmurs, rubs or gallops noted.   Abdomen: Soft, non-tender and non-distended with normal bowel sounds appreciated on auscultation.  Extremities: There is no pitting edema in the distal lower extremities bilaterally.   Skin: Warm and dry without trophic changes noted.   Musculoskeletal: exam reveals no obvious joint deformities, tenderness or joint swelling or erythema.   Neurologically:  Mental status: The patient is awake, alert and oriented in all 4 spheres. His immediate and remote memory, attention, language skills and fund of knowledge are appropriate. There is no evidence of  aphasia, agnosia, apraxia or anomia. Speech is clear with normal prosody and enunciation. Thought process is linear. Mood is normal and affect is normal.  Cranial nerves II - XII are as described above under HEENT exam.  Motor exam: Normal bulk, strength and tone is noted, with the exception of slight grip strength weakness in the right hand. There is no tremor. Fine motor skills and coordination: grossly intact.  Cerebellar testing: No dysmetria or intention tremor. There is no truncal or gait ataxia.  Sensory exam: intact to light touch in the upper and lower extremities.  Gait, station and balance: He stands easily. No veering to one side is noted. No leaning to one side is noted. Posture is age-appropriate and stance is narrow based. Gait shows normal stride length and normal pace. No  problems turning are noted.   Assessment and Plan:  In summary, Manuel Boyer is a very pleasant 31 y.o.-year old male with an underlying medical history of stroke, carotid artery dissection, moyamoya disease, and obesity, whose history and physical exam are concerning for obstructive sleep apnea (OSA). I had a long chat with the patient about my findings and the diagnosis of OSA, its prognosis and treatment options. We talked about medical treatments, surgical interventions and non-pharmacological approaches. I explained in particular the risks and ramifications of untreated moderate to severe OSA, especially with respect to developing cardiovascular disease down the Road, including congestive heart failure, difficult to treat hypertension, cardiac arrhythmias, or stroke. Even type 2 diabetes has, in part, been linked to untreated OSA. Symptoms of untreated OSA include daytime sleepiness, memory problems, mood irritability and mood disorder such as depression and anxiety, lack of energy, as well as recurrent headaches, especially morning headaches. We talked about smoking cessation and trying to maintain a healthy  lifestyle in general, as well as the importance of weight control. We also talked about the importance of good sleep hygiene. I recommended the following at this time: sleep study.  I outlined the differences between a laboratory attended sleep study versus home sleep test. I explained the sleep test procedure to the patient and also outlined possible surgical and non-surgical treatment options of OSA, including the use of a custom-made dental device (which would require a referral to a specialist dentist or oral surgeon), upper airway surgical options, such as traditional UPPP or a novel less invasive surgical option in the form of Inspire hypoglossal nerve stimulation (which would involve a referral to an ENT surgeon). I also explained the CPAP treatment option to the patient, who indicated that he would be willing to try CPAP if the need arises. I explained the importance of being compliant with PAP treatment, not only for insurance purposes but primarily to improve His symptoms, and for the patient's long term health benefit, including to reduce His cardiovascular risks. I answered all his questions today and the patient was in agreement. I plan to see him back after the sleep study is completed and encouraged him to call with any interim questions, concerns, problems or updates.   Thank you very much for allowing me to participate in the care of this nice patient. If I can be of any further assistance to you please do not hesitate to call me at [phone].  Sincerely,   Huston Foley, MD, PhD

## 2021-08-02 NOTE — Patient Instructions (Signed)

## 2021-08-21 ENCOUNTER — Other Ambulatory Visit: Payer: Self-pay | Admitting: Internal Medicine

## 2021-08-21 DIAGNOSIS — I675 Moyamoya disease: Secondary | ICD-10-CM

## 2021-09-05 ENCOUNTER — Ambulatory Visit: Payer: 59 | Admitting: Neurology

## 2021-09-19 ENCOUNTER — Ambulatory Visit (HOSPITAL_COMMUNITY)
Admission: RE | Admit: 2021-09-19 | Discharge: 2021-09-19 | Disposition: A | Discharge status: Home / Self Care | Payer: Managed Care, Other (non HMO) | Source: Ambulatory Visit | Attending: Student | Admitting: Student

## 2021-09-19 ENCOUNTER — Encounter: Payer: Self-pay | Admitting: Neurology

## 2021-09-19 ENCOUNTER — Encounter (HOSPITAL_COMMUNITY): Payer: Self-pay

## 2021-09-19 VITALS — BP 124/75 | HR 73 | Temp 98.5°F | Resp 18

## 2021-09-19 DIAGNOSIS — Z113 Encounter for screening for infections with a predominantly sexual mode of transmission: Secondary | ICD-10-CM

## 2021-09-19 DIAGNOSIS — Z202 Contact with and (suspected) exposure to infections with a predominantly sexual mode of transmission: Secondary | ICD-10-CM | POA: Insufficient documentation

## 2021-09-19 MED ORDER — METRONIDAZOLE 500 MG PO TABS: 2000.0000 mg | ORAL_TABLET | Freq: Once | ORAL | 0 refills | Status: AC

## 2021-09-19 NOTE — ED Triage Notes (Signed)
Pt presents with penile pain and requests STD testing.  ?

## 2021-09-19 NOTE — ED Provider Notes (Signed)
?[facility] ? ? ? ?CSN: [id number] ?Arrival date & time: 09/19/21  1518 ? ? ?  ? ?History   ?Chief Complaint ?Chief Complaint  ?Patient presents with  ? SEXUALLY TRANSMITTED DISEASE  ? ? ?HPI ?Manuel Boyer is a 31 y.o. male presenting for STI screen following exposure to trichomonas.  History CVA.  He is currently asymptomatic, but states his male partner told him that she has trichomoniasis. Denies hematuria, dysuria, frequency, urgency, back pain, n/v/d/abd pain, fevers/chills, abdnormal penile discharge, penile rash or lesion. ? ? ?HPI ? ?Past Medical History:  ?Diagnosis Date  ? Internal carotid artery dissection (HCC)   ? Moya moya disease   ? Stroke [facility])   ? ? ?Patient Active Problem List  ? Diagnosis Date Noted  ? Routine general medical examination at a health care facility 09/13/2020  ? Hyperlipidemia with target LDL less than 100 09/13/2020  ? Hyperglycemia 09/13/2020  ? Internal carotid artery dissection (HCC) 12/10/2016  ? 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 History of tobacco use 12/10/2016  ? CVA (cerebral vascular accident) (HCC) 11/28/2016  ? ? ?Past Surgical History:  ?Procedure Laterality Date  ? IR ANGIO INTRA EXTRACRAN SEL COM CAROTID INNOMINATE BILAT MOD SED  11/28/2016  ? IR ANGIO VERTEBRAL SEL VERTEBRAL BILAT MOD SED  11/28/2016  ? RADIOLOGY WITH ANESTHESIA N/A 11/28/2016  ? Procedure: RADIOLOGY WITH ANESTHESIA;  Surgeon: Julieanne Cotton, MD;  Location: [facility] OR;  Service: Radiology;  Laterality: N/A;  ? ? ? ? ? ?Home Medications   ? ?Prior to Admission medications   ?Medication Sig Start Date End Date Taking? Authorizing Provider  ?metroNIDAZOLE (FLAGYL) 500 MG tablet Take 4 tablets (2,000 mg total) by mouth once for 1 dose. Avoid alcohol while taking this medication and for 2 days after 09/19/21 09/19/21 Yes Rhys Martini, PA-C  ?ASPIRIN LOW DOSE 81 MG EC tablet TAKE 1 TABLET(81 MG) BY MOUTH DAILY 08/21/21   Etta Grandchild, MD  ?loratadine (CLARITIN) 10 MG tablet Take by mouth.     [provider]  ?pravastatin (PRAVACHOL) 20 MG tablet Take 1 tablet (20 mg total) by mouth daily. 09/13/20   Etta Grandchild, MD  ? ? ?Family History ?Family History  ?Problem Relation Age of Onset  ? Breast cancer Mother   ? Prostate cancer Father   ? ? ?Social History ?Social History  ? ?Tobacco Use  ? Smoking status: Former  ?  Packs/day: 0.00  ?  Types: Cigars, Cigarettes  ? Smokeless tobacco: Never  ? Tobacco comments:  ?  black mild smokes 3 per month  ?Substance Use Topics  ? Alcohol use: Yes  ?  Alcohol/week: 6.0 standard drinks  ?  Types: 4 Cans of beer, 2 Shots of liquor per week  ? Drug use: Yes  ?  Types: Marijuana  ?  Comment: last took June 2018  ? ? ? ?Allergies   ?Uncaria tomentosa (cats claw) and Molds & smuts ? ? ?Review of Systems ?Review of Systems  ?Constitutional:  Negative for chills and fever.  ?HENT:  Negative for sore throat.   ?Eyes:  Negative for pain and redness.  ?Respiratory:  Negative for shortness of breath.   ?Cardiovascular:  Negative for chest pain.  ?Gastrointestinal:  Negative for abdominal pain, diarrhea, nausea and vomiting.  ?Genitourinary:  Negative for decreased urine volume, difficulty urinating, dysuria, flank pain, frequency, genital sores, hematuria and urgency.  ?Musculoskeletal:  Negative for back pain.  ?Skin:  Negative for rash.  ?All  other systems reviewed and are negative. ? ? ?Physical Exam ?Triage Vital Signs ?ED Triage Vitals [09/19/21 1558]  ?Enc Vitals Group  ?   BP 124/75  ?   Pulse Rate 73  ?   Resp 18  ?   Temp 98.5 ?F (36.9 ?C)  ?   Temp Source Oral  ?   SpO2 94 %  ?   Weight   ?   Height   ?   Head Circumference   ?   Peak Flow   ?   Pain Score 1  ?   Pain Loc   ?   Pain Edu?   ?   Excl. in [place]?   ? ?No data found. ? ?Updated Vital Signs ?BP 124/75 (BP Location: Right Arm)   Pulse 73   Temp 98.5 ?F (36.9 ?C) (Oral)   Resp 18   SpO2 94%  ? ?Visual Acuity ?Right Eye Distance:   ?Left Eye Distance:   ?Bilateral Distance:   ? ?Right Eye Near:    ?Left Eye Near:    ?Bilateral Near:    ? ?Physical Exam ?Vitals reviewed.  ?Constitutional:   ?   General: He is not in acute distress. ?   Appearance: Normal appearance. He is not ill-appearing.  ?HENT:  ?   Head: Normocephalic and atraumatic.  ?   Mouth/Throat:  ?   Mouth: Mucous membranes are moist.  ?   Comments: Moist mucous membranes ?Eyes:  ?   Extraocular Movements: Extraocular movements intact.  ?   Pupils: Pupils are equal, round, and reactive to light.  ?Cardiovascular:  ?   Rate and Rhythm: Normal rate and regular rhythm.  ?   Heart sounds: Normal heart sounds.  ?Pulmonary:  ?   Effort: Pulmonary effort is normal.  ?   Breath sounds: Normal breath sounds. No wheezing, rhonchi or rales.  ?Abdominal:  ?   General: Bowel sounds are normal. There is no distension.  ?   Palpations: Abdomen is soft. There is no mass.  ?   Tenderness: There is no abdominal tenderness. There is no right CVA tenderness, left CVA tenderness, guarding or rebound.  ?Genitourinary: ?   Comments: deferred ?Skin: ?   General: Skin is warm.  ?   Capillary Refill: Capillary refill takes less than 2 seconds.  ?   Comments: Good skin turgor  ?Neurological:  ?   General: No focal deficit present.  ?   Mental Status: He is alert and oriented to person, place, and time.  ?Psychiatric:     ?   Mood and Affect: Mood normal.     ?   Behavior: Behavior normal.  ? ? ? ?UC Treatments / Results  ?Labs ?(all labs ordered are listed, but only abnormal results are displayed) ?Labs Reviewed  ?CYTOLOGY, (ORAL, ANAL, URETHRAL) ANCILLARY ONLY  ? ? ?EKG ? ? ?Radiology ?No results found. ? ?Procedures ?Procedures (including critical care time) ? ?Medications Ordered in UC ?Medications - No data to display ? ?Initial Impression / Assessment and Plan / UC Course  ?I have reviewed the triage vital signs and the nursing notes. ? ?Pertinent labs & imaging results that were available during my care of the patient were reviewed by me and considered in my medical  decision making (see chart for details). ? ?  ? ?This patient is a very pleasant 31 y.o. year old male presenting with exposure to trichomonas - asymptomatic. Afebrile, nontachycardic, no reproducible abd pain or CVAT. ? ?  Will send self-swab for G/C, trich. Declines HIV, RPR. Safe sex precautions.  ? ?Flagyl sent as below.  ? ?ED return precautions discussed. Patient verbalizes understanding and agreement.  ? ?Final Clinical Impressions(s) / UC Diagnoses  ? ?Final diagnoses:  ?Exposure to trichomonas  ?Routine screening for STI (sexually transmitted infection)  ? ? ? ?Discharge Instructions   ? ?  ?-Flagyl (Metronidazole) four pills taken together with food.  ?-Avoid alcohol for 48 hours after taking ?-Abstain from intercourse x7 days until you and your partner have completed treatment  ? ? ?ED Prescriptions   ? ? Medication Sig Dispense Auth. Provider  ? metroNIDAZOLE (FLAGYL) 500 MG tablet Take 4 tablets (2,000 mg total) by mouth once for 1 dose. Avoid alcohol while taking this medication and for 2 days after 4 tablet Hazel Sams, PA-C  ? ?  ? ?PDMP not reviewed this encounter. ?  ?Hazel Sams, PA-C ?09/19/21 1639 ? ?

## 2021-09-19 NOTE — Discharge Instructions (Addendum)
-  Flagyl (Metronidazole) four pills taken together with food.  ?-Avoid alcohol for 48 hours after taking ?-Abstain from intercourse x7 days until you and your partner have completed treatment  ?

## 2021-09-20 LAB — CYTOLOGY, (ORAL, ANAL, URETHRAL) ANCILLARY ONLY
Chlamydia: NEGATIVE
Comment: NEGATIVE
Comment: NEGATIVE
Comment: NORMAL
Neisseria Gonorrhea: NEGATIVE
Trichomonas: POSITIVE — AB

## 2021-10-10 ENCOUNTER — Ambulatory Visit (INDEPENDENT_AMBULATORY_CARE_PROVIDER_SITE_OTHER): Payer: Commercial Managed Care - HMO | Admitting: Neurology

## 2021-10-10 DIAGNOSIS — G4720 Circadian rhythm sleep disorder, unspecified type: Secondary | ICD-10-CM

## 2021-10-10 DIAGNOSIS — R0683 Snoring: Secondary | ICD-10-CM

## 2021-10-10 DIAGNOSIS — I675 Moyamoya disease: Secondary | ICD-10-CM

## 2021-10-10 DIAGNOSIS — Z8673 Personal history of transient ischemic attack (TIA), and cerebral infarction without residual deficits: Secondary | ICD-10-CM

## 2021-10-10 DIAGNOSIS — E669 Obesity, unspecified: Secondary | ICD-10-CM

## 2021-10-10 DIAGNOSIS — R0681 Apnea, not elsewhere classified: Secondary | ICD-10-CM

## 2021-10-19 ENCOUNTER — Telehealth: Payer: Self-pay

## 2021-10-19 NOTE — Telephone Encounter (Signed)
-----   Message from Anda Latina, RN sent at 10/19/2021  1:26 PM EDT ----- ? ?----- Message ----- ?From: Star Age, MD ?Sent: 10/19/2021  12:21 PM EDT ?To: Gna[facility] 4 Results ? ?Patient referred by Dr. Leonie Man, seen by me on 08/02/21, diagnostic PSG on 10/10/21.   ?Please call and notify the patient that the recent sleep study did not show any significant obstructive sleep apnea  with the exception of snoring and mild supine and REM related OSA. Treatment with a positive airway pressure device, such as CPAP or autoPAP is not indicated. Weight loss and avoidance of the supine sleep position may alleviate his positional and stage related OSA. He can follow up with Dr. Leonie Man and his other providers as scheduled/planned.  ?Thanks, ? ?Star Age, MD, PhD ?[facility]) ? ? ?

## 2021-10-19 NOTE — Telephone Encounter (Signed)
I called the pt and we reiviewed the results of the sleep study.  ?Pt verbalized understanding and agreement to avoidiance of sleeping on his back and weight loss efforts. He also agreed to following up with Dr. Pearlean Brownie as recommended. Upon review of the chart in April Dr. Pearlean Brownie advised to f/u in April of 2023, but f/u was not made. ? ? ?I have scheduled for next available with Dr. Pearlean Brownie, 12/26/2021 at 930 am.  ?Pt was appreciative for the call.  ? ?

## 2021-10-19 NOTE — Procedures (Signed)
PATIENT'S NAME:  Manuel Boyer, Manuel Boyer ?DOB:      09-09-1990      ?MR#:    [id number]     ?DATE OF RECORDING: 10/10/2021 ?REFERRING M.D.:  Delia Heady, MD ?Study Performed:   Baseline Polysomnogram ?HISTORY: 31 year old man with a history of stroke, carotid artery dissection, moyamoya disease, and obesity, who reports snoring and witnessed apneas, some daytime tiredness. The patient endorsed the Epworth Sleepiness Scale at 8 points. The patient's weight 242 pounds with a height of 70 (inches), resulting in a BMI of 34.7 kg/m2. The patient's neck circumference measured 16 5/8 inches. ? ?CURRENT MEDICATIONS: Aspirin, Claritin, Pravachol ?  ?PROCEDURE:  This is a multichannel digital polysomnogram utilizing the Somnostar 11.2 system.  Electrodes and sensors were applied and monitored per AASM Specifications.   EEG, EOG, Chin and Limb EMG, were sampled at 200 Hz.  ECG, Snore and Nasal Pressure, Thermal Airflow, Respiratory Effort, CPAP Flow and Pressure, Oximetry was sampled at 50 Hz. Digital video and audio were recorded.     ? ?BASELINE STUDY ? ?Lights Out was at 21:49 and Lights On at 04:49.  Total recording time (TRT) was 421 minutes, with a total sleep time (TST) of 359.5 minutes.   The patient's sleep latency was 7 minutes.  REM latency was 106.5 minutes, which is high normal. The sleep efficiency was 85.4 %.  ?   ?SLEEP ARCHITECTURE: WASO (Wake after sleep onset) was 54.5 minutes with minimal to mild sleep fragmentation noted. There were 28.5 minutes in Stage N1, 190 minutes Stage N2, 66.5 minutes Stage N3 and 74.5 minutes in Stage REM.  The percentage of Stage N1 was 7.9%, Stage N2 was 52.9%, which is normal, Stage N3 was 18.5% and Stage R (REM sleep) was 20.7%, which is normal. The arousals were noted as: 95 were spontaneous, 0 were associated with PLMs, 6 were associated with respiratory events. ? ?RESPIRATORY ANALYSIS:  There were a total of 14 respiratory events:  1 obstructive apneas, 0 central apneas and 0 mixed  apneas with a total of 1 apneas and an apnea index (AI) of .2 /hour. There were 13 hypopneas with a hypopnea index of 2.2 /hour. The patient also had 0 respiratory event related arousals (RERAs).  ?    ?The total APNEA/HYPOPNEA INDEX (AHI) was 2.3/hour and the total RESPIRATORY DISTURBANCE INDEX was  2.3 /hour.  11 events occurred in REM sleep and 6 events in NREM. The REM AHI was  8.9 /hour, versus a non-REM AHI of .6. The patient spent 137 minutes of total sleep time in the supine position and 223 minutes in non-supine.. The supine AHI was 6.1 versus a non-supine AHI of 0.0. ? ?OXYGEN SATURATION & C02:  The Wake baseline 02 saturation was 94%, with the lowest being 86%. Time spent below 89% saturation equaled 6 minutes. ? ?PERIODIC LIMB MOVEMENTS: The patient had a total of 8 Periodic Limb Movements.  The Periodic Limb Movement (PLM) index was 1.3 and the PLM Arousal index was 0/hour. ?Audio and video analysis did not show any abnormal or unusual movements, behaviors, phonations or vocalizations. The patient took no bathroom breaks. Mild to moderate snoring was noted. The EKG was in keeping with normal sinus rhythm (NSR). ? ?Post-study, the patient indicated that sleep was the same as usual.  ? ?IMPRESSION: ? ?Primary Snoring ?Dysfunctions associated with sleep stages or arousal from sleep ? ?RECOMMENDATIONS: ? ?This study does not demonstrate any significant obstructive or central sleep disordered breathing with the exception of snoring  and mild supine and REM related OSA. Treatment with a positive airway pressure device, such as CPAP or autoPAP is not indicated. Weight loss and avoidance of the supine sleep position may alleviate his positional and stage related OSA.  ?This study shows some sleep fragmentation and but normal sleep stage percentages; these are nonspecific findings and per se do not signify an intrinsic sleep disorder or a cause for the patient's sleep-related symptoms. Causes include (but are not  limited to) the first night effect of the sleep study, circadian rhythm disturbances, medication effect or an underlying mood disorder or medical problem.  ?The patient should be cautioned not to drive, work at heights, or operate dangerous or heavy equipment when tired or sleepy. Review and reiteration of good sleep hygiene measures should be pursued with any patient. ?The patient will be advised to follow up with the referring provider, who will be notified of the test results. ? ?I certify that I have reviewed the entire raw data recording prior to the issuance of this report in accordance with the Standards of Accreditation of the American Academy of Sleep Medicine (AASM) ? ? ?Huston Foley, MD, PhD ?Diplomat, American Board of Neurology and Sleep Medicine (Neurology and Sleep Medicine) ? ? ?

## 2021-12-26 ENCOUNTER — Encounter: Payer: Self-pay | Admitting: Neurology

## 2021-12-26 ENCOUNTER — Ambulatory Visit: Payer: Commercial Managed Care - HMO | Admitting: Neurology

## 2021-12-26 VITALS — BP 114/64 | HR 74 | Ht 69.6 in | Wt 240.8 lb

## 2021-12-26 DIAGNOSIS — I675 Moyamoya disease: Secondary | ICD-10-CM | POA: Diagnosis not present

## 2021-12-26 DIAGNOSIS — Z8673 Personal history of transient ischemic attack (TIA), and cerebral infarction without residual deficits: Secondary | ICD-10-CM

## 2021-12-26 NOTE — Patient Instructions (Signed)
I had a long discussion with the patient regarding his remote stroke and moyamoya disease and revascularization brain surgery and answered questions.  I recommend he stay on aspirin 81 mg daily for stroke prevention as well as on Pravachol and maintain aggressive risk factor modification with strict control of hypertension blood pressure goal below 140/90, lipids with LDL cholesterol goal below 70 mg percent and diabetes with hemoglobin A1c goal below 6.5%.  I also encouraged him to eat a healthy diet with lots of fruits, vegetables, cereals, whole grains and to be active and exercise regularly.  The patient is concerned because he is losing weight and has an appointment to see his primary care physician and recommend he have lab work done at that visit including follow-up lipid profile and hemoglobin A1c.  I have also encouraged him to keep himself well-hydrated and drink adequate fluids.  He will return for follow-up in the future in a year or call earlier if necessary.

## 2021-12-26 NOTE — Progress Notes (Signed)
[facility] [address] [place]. [place] [address] [phone]       OFFICE FOLLOW-UP NOTE  [name] Date of Birth:  01-31-1991 Medical Record Number:  [id number]   HPI: Initial visit 01/05/2017 ; [name] is a 25 year pleasant African-American male seen today for first office follow-up visit following hospital admission for stroke in June 2018. Manuel Boyer is an 31 y.o. male with no significant past medical history. Approximately 5 days prior to admission on 11/28/16 patient noticed some neck discomfort with slurred speech and some right arm weakness. Apparently last night at 2000 hrs. patient was noted it worse and has significant word finding difficulties and right arm weakness. 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 There is also noted hypodensity in the left frontal lobe most compatible with a subacute infarct. The occlusion the left carotid internal artery with a taper lumen as noted above was consistent with a dissection. 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 As far as familypatient does not have sickle cell. 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 Due to the multiple arterial abnormalities secondary to moyamoya and the leptomeningeal collaterals patient is going to be sent to interventional radiology to get a conventional angiogram to further evaluate if this isn't M1 occlusion and if this is possible to intervene on. Date last known well: Date: 11/23/2016 Time last known well: Time: 20:00 tPA Given: No: out of window.. Diagnostic labs were collected and vascular disease, lupus, sickle cell, vasculitic disorders and hypercarbia states were all negative. LDL cholesterol was 104mg  percent. Hb A1c was 5.7. Transthoraxic echo showed normal ejection  fraction. Patient did give a history of habitual neck popping about to 3 times a day. He denied any significant neck injury or chiropractic ventilation in the weeks or months prior to his presentation. Cerebral catheter angiogram performed by Dr. showed occlusion of the terminal right internal carotid artery with dilated leptomeningeal collaterals. The left internal carotid artery was occluded close to its origin with a flame-shaped appearance. Patient was started on dual antiplatelet therapy aspirin and Plavix and on statin for his lipids. He states his done well since discharge. He has not had any problems with bleeding or bruising. His blood pressure is well controlled and today it is [id number]. He is getting outpatient physical occupational speech therapy. His speech is improved but he still has to struggle to find words in complete sentences. Is also noticed diminished fine motor skills in his hand as well as some intermittent tingling and numbness as well. 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 The patient is also getting a second opinion at [facility] for his moyamoya and carotid dissection and has an appointment to see Dr. [facility] in the next few weeks. Update 04/10/2017 ; he returns for follow-up after last visit with me 3 months ago.  He states he is doing well he still has some word finding difficulties and speech difficulties but these appear to be improving.  He has had no recurrent stroke or TIA symptoms.  Is tolerating aspirin well without bruising and bleeding.  For unclear reasons he seems to have stopped the Pravachol.  He tells me that his primary physician  asked him to do so but when I reviewed the primary physician Notes I clearly did not see any mention of this.  I have counseled the patient and advised him to start taking Pravachol again.  He was seen on consultation by Dr. Alinda Money vascular neurosurgeon at [facility] on 10/3/18who did a cerebral catheter angiogram on 03/30/17 which confirmed bilateral left greater than right changes of moyamoya and has recommended dural encephalo synangiosis surgery which is scheduled for January 9.. I have reviewed these records in care everywhere.  He has no new complaints today. Update 07/31/2017 : Returns for follow-up after last visit with me the months ago. He is accompanied by his mother. He underwent left encephaloduroarteriosynangiosis by Dr. Kennis Carina at [facility] on 06/13/17. I have reviewed these records in care everywhere The surgery went well. He had some transient worsening of his speech difficulties following surgery but that seems to be improving. He still has some hesitant speech with word hesitation and occasional word finding difficulties. His right grip strength is improving but he is to have some weakness. He remains on aspirin which is tolerating well without bleeding or bruising. States his blood pressure is well controlled and today it is 110/84. He remains some protocol which is tolerating well. He has scheduled follow-up visit at [facility] in a few months.  Updated 01/29/2018 :he returns for follow-up after last visit 6 months ago.He's had no further stroke or TIA symptoms. He continues to tolerate aspirin well with only minor bruising and no bleeding. He is tolerating Pravachol well without muscle aches and pains. He continues to complain of mild short-term memory difficulties. He has trouble remembering things even if he tries to write it down. His facility has been denied twice. He and his family wonders if he may benefit with referral to neuropsych testing. Patient was seen by neurosurgeon Dr. Alinda Money at [facility] on 08/10/17 and is not to follow for a year. He continues to have only occasional speech difficulties particularly when he is tired. No new complaints.  Update 03/07/2021 ; he returns for follow-up after last visit nearly 3 years  ago.  Is accompanied by his wife.  He is doing well and has had no recurrent stroke or TIA symptoms.  He remains on aspirin for his tolerating well without bleeding or bruising.  His blood pressure is well controlled today it is 112/63.  He is tolerating Pravachol well without muscle aches and pains.  He had follow-up MRA of the brain done on 06/11/2019 at [facility] which shows patent left superficial temporal artery to middle cerebral artery bypass with diffuse narrowing of the left middle cerebral artery throughout..  Cerebral catheter angiogram on 06/10/2018 had shown patent both carotid arteries in the neck with diffuse severe narrowing of the terminal right internal carotid artery.  On the left side middle cerebral artery was diffusely narrow but left superficial temporal artery to middle cerebral artery bypass was patent.  Patient states that he does snore a lot at night but sleeps well.  He has never been tested for sleep apnea.  He has started driving trucks but states he is never felt sleepy while driving. Update 12/26/2021 ; he returns for follow-up after last visit in October 2022.  He continues to do well.  He said no recurrent stroke or TIA symptoms.  He remains on aspirin 81 mg which is tolerating well without bleeding or bruising.  Blood pressure remains well controlled and today it is 114/64.  He states  he has been losing weight and his wife is concerned and he has made an appointment with primary care physician next week to evaluate this.  He remains on Pravachol which is tolerating well without muscle aches and pains.  Lab work at last visit on 03/07/2021 had shown hemoglobin A1c to be borderline at 6.0 and LDL cholesterol to be acceptable at 66 mg percent.  He has had no new health problems.  He continues to drive trucks but has not had any issues.  He is independent in actives of daily living.  He has no new complaints today. ROS:   14 system review of systems is positive for mild speech difficulties,and  memory loss only and all other systems negative  PMH:  Past Medical History:  Diagnosis Date   Internal carotid artery dissection (HCC)    Moya moya disease    Stroke (HCC)     Social History:  Social History   Socioeconomic History   Marital status: Married    Spouse name: Not on file   Number of children: Not on file   Years of education: Not on file   Highest education level: Not on file  Occupational History   Not on file  Tobacco Use   Smoking status: Former    Packs/day: 0.00    Types: Cigars, Cigarettes   Smokeless tobacco: Never   Tobacco comments:    black mild smokes 3 per month  Substance and Sexual Activity   Alcohol use: Yes    Alcohol/week: 6.0 standard drinks of alcohol    Types: 4 Cans of beer, 2 Shots of liquor per week   Drug use: Yes    Types: Marijuana    Comment: last took June 2018   Sexual activity: Yes    Partners: Female  Other Topics Concern   Not on file  Social History Narrative   Not on file   Social Determinants of Health   Financial Resource Strain: Not on file  Food Insecurity: Not on file  Transportation Needs: Not on file  Physical Activity: Not on file  Stress: Not on file  Social Connections: Not on file  Intimate Partner Violence: Not on file    Medications:   Current Outpatient Medications on File Prior to Visit  Medication Sig Dispense Refill   ASPIRIN LOW DOSE 81 MG EC tablet TAKE 1 TABLET(81 MG) BY MOUTH DAILY 90 tablet 1   loratadine (CLARITIN) 10 MG tablet Take by mouth.     pravastatin (PRAVACHOL) 20 MG tablet Take 1 tablet (20 mg total) by mouth daily. 90 tablet 3   No current facility-administered medications on file prior to visit.    Allergies:   Allergies  Allergen Reactions   Uncaria Tomentosa (Cats Claw) Other (See Comments)    snezzing snezzing    Molds & Smuts Itching    Physical Exam General: obese young African-American male, seated, in no evident distress Head: head normocephalic and  atraumatic.  Neck: supple with no carotid or supraclavicular bruits Cardiovascular: regular rate and rhythm, no murmurs Musculoskeletal: no deformity Skin:  no rash/petichiae Vascular:  Normal pulses all extremities Vitals:   12/26/21 0923  BP: 114/64  Pulse: 74   Neurologic Exam Mental Status: Awake and fully alert. Oriented to place and time. Recent and remote memory intact. Attention span, concentration and fund of knowledge appropriate. Mood and affect appropriate. Mild  nonfluent speech with some word finding difficulties   Able to repeat comprehend and name well. Cranial Nerves:  Fundoscopic exam not done. Pupils equal, briskly reactive to light. Extraocular movements full without nystagmus. Visual fields full to confrontation. Hearing intact. Facial sensation intact. Mild right lower facial asymmetry on smiling., tongue, palate moves normally and symmetrically.  Motor: Normal bulk and tone. Normal strength in all tested extremity muscles except mild weakness of right grip and fine finger movements in the right hand. Orbits left over right upper extremity.. Sensory.: intact to touch ,pinprick .position and vibratory sensation.  Coordination: Rapid alternating movements normal in all extremities. Finger-to-nose and heel-to-shin performed accurately bilaterally. Gait and Station: Arises from chair without difficulty. Stance is normal. Gait demonstrates normal stride length and balance . Able to heel, toe and tandem walk without difficulty.  Reflexes: 1+ and symmetric. Toes downgoing.       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  Kennis Carina.  Patient is doing well without recurrent stroke or TIA symptoms.  Vascular risk factors of obesity and at risk for sleep apnea       PLAN: I had a long discussion with the patient regarding his remote stroke and moyamoya disease and revascularization brain surgery and answered questions.  I recommend he stay on aspirin 81 mg daily for stroke prevention as well as on Pravachol and maintain aggressive risk factor modification with strict control of hypertension blood pressure goal below 140/90, lipids with LDL cholesterol goal below 70 mg percent and diabetes with hemoglobin A1c goal below 6.5%.  I also encouraged him to eat a healthy diet with lots of fruits, vegetables, cereals, whole grains and to be active and exercise regularly.  The patient is concerned because he is losing weight and has an appointment to see his primary care physician and recommend he have lab work done at that visit including follow-up lipid profile and hemoglobin A1c.  I have also encouraged him to keep himself well-hydrated and drink adequate fluids.  He will return for follow-up in the future in a year or call earlier if necessary. Greater than 50% of time during this 30 minute visit was spent on counseling,explanation of diagnosis of carotid dissection, moyamoya disease, stroke and aphasia, planning of further management, discussion with patient and family and coordination of care Delia Heady, MD  [facility] [address] [place], [place] [address]  Phone [phone] Fax [phone] Note: This document was prepared with digital dictation and possible smart phrase technology. Any transcriptional errors that result from this process are unintentional

## 2022-01-26 ENCOUNTER — Ambulatory Visit: Payer: Managed Care, Other (non HMO) | Admitting: Internal Medicine

## 2022-03-05 ENCOUNTER — Other Ambulatory Visit: Payer: Self-pay | Admitting: Internal Medicine

## 2022-03-05 DIAGNOSIS — E785 Hyperlipidemia, unspecified: Secondary | ICD-10-CM

## 2022-03-05 DIAGNOSIS — I675 Moyamoya disease: Secondary | ICD-10-CM

## 2022-06-08 ENCOUNTER — Encounter: Payer: Commercial Managed Care - HMO | Admitting: Internal Medicine

## 2022-06-12 ENCOUNTER — Ambulatory Visit (INDEPENDENT_AMBULATORY_CARE_PROVIDER_SITE_OTHER): Payer: Commercial Managed Care - HMO | Admitting: Internal Medicine

## 2022-06-12 ENCOUNTER — Ambulatory Visit (INDEPENDENT_AMBULATORY_CARE_PROVIDER_SITE_OTHER): Payer: Commercial Managed Care - HMO

## 2022-06-12 ENCOUNTER — Encounter: Payer: Self-pay | Admitting: Internal Medicine

## 2022-06-12 VITALS — BP 122/76 | HR 72 | Temp 98.3°F | Resp 16 | Ht 69.6 in | Wt 236.0 lb

## 2022-06-12 DIAGNOSIS — R052 Subacute cough: Secondary | ICD-10-CM | POA: Diagnosis not present

## 2022-06-12 DIAGNOSIS — E785 Hyperlipidemia, unspecified: Secondary | ICD-10-CM

## 2022-06-12 DIAGNOSIS — R739 Hyperglycemia, unspecified: Secondary | ICD-10-CM

## 2022-06-12 DIAGNOSIS — Z0000 Encounter for general adult medical examination without abnormal findings: Secondary | ICD-10-CM | POA: Diagnosis not present

## 2022-06-12 DIAGNOSIS — I675 Moyamoya disease: Secondary | ICD-10-CM

## 2022-06-12 LAB — BASIC METABOLIC PANEL
BUN: 16 mg/dL (ref 6–23)
CO2: 28 mEq/L (ref 19–32)
Calcium: 9.2 mg/dL (ref 8.4–10.5)
Chloride: 106 mEq/L (ref 96–112)
Creatinine, Ser: 1.27 mg/dL (ref 0.40–1.50)
GFR: 74.13 mL/min (ref >60.00)
Glucose, Bld: 89 mg/dL (ref 70–99)
Potassium: 3.9 mEq/L (ref 3.5–5.1)
Sodium: 142 mEq/L (ref 135–145)

## 2022-06-12 LAB — CBC WITH DIFFERENTIAL/PLATELET
Basophils Absolute: 0 10*3/uL (ref 0.0–0.1)
Basophils Relative: 0.5 % (ref 0.0–3.0)
Eosinophils Absolute: 0.1 10*3/uL (ref 0.0–0.7)
Eosinophils Relative: 0.7 % (ref 0.0–5.0)
HCT: 42.1 % (ref 39.0–52.0)
Hemoglobin: 14.3 g/dL (ref 13.0–17.0)
Lymphocytes Relative: 43.5 % (ref 12.0–46.0)
Lymphs Abs: 3.6 10*3/uL (ref 0.7–4.0)
MCHC: 33.9 g/dL (ref 30.0–36.0)
MCV: 92.3 fl (ref 78.0–100.0)
Monocytes Absolute: 0.5 10*3/uL (ref 0.1–1.0)
Monocytes Relative: 6.1 % (ref 3.0–12.0)
Neutro Abs: 4 10*3/uL (ref 1.4–7.7)
Neutrophils Relative %: 49.2 % (ref 43.0–77.0)
Platelets: 266 10*3/uL (ref 150.0–400.0)
RBC: 4.56 Mil/uL (ref 4.22–5.81)
RDW: 14.1 % (ref 11.5–15.5)
WBC: 8.2 10*3/uL (ref 4.0–10.5)

## 2022-06-12 LAB — HEPATIC FUNCTION PANEL
ALT: 35 U/L (ref 0–53)
AST: 20 U/L (ref 0–37)
Albumin: 4.2 g/dL (ref 3.5–5.2)
Alkaline Phosphatase: 78 U/L (ref 39–117)
Bilirubin, Direct: 0 mg/dL (ref 0.0–0.3)
Total Bilirubin: 0.2 mg/dL (ref 0.2–1.2)
Total Protein: 6.8 g/dL (ref 6.0–8.3)

## 2022-06-12 LAB — LIPID PANEL
Cholesterol: 160 mg/dL (ref 0–200)
HDL: 29.2 mg/dL — ABNORMAL LOW (ref >39.00)
NonHDL: 131.26
Total CHOL/HDL Ratio: 5
Triglycerides: 304 mg/dL — ABNORMAL HIGH (ref 0.0–149.0)
VLDL: 60.8 mg/dL — ABNORMAL HIGH (ref 0.0–40.0)

## 2022-06-12 LAB — LDL CHOLESTEROL, DIRECT: Direct LDL: 104 mg/dL

## 2022-06-12 LAB — HEMOGLOBIN A1C: Hgb A1c MFr Bld: 6.1 % (ref 4.6–6.5)

## 2022-06-12 LAB — TSH: TSH: 0.57 u[IU]/mL (ref 0.35–5.50)

## 2022-06-12 MED ORDER — ASPIRIN 81 MG PO TBEC: DELAYED_RELEASE_TABLET | ORAL | 1 refills | Status: AC

## 2022-06-12 MED ORDER — PRAVASTATIN SODIUM 20 MG PO TABS: 20.0000 mg | ORAL_TABLET | Freq: Every day | ORAL | 0 refills | Status: AC

## 2022-06-12 NOTE — Progress Notes (Unsigned)
Subjective:  Patient ID: Manuel Boyer, male    DOB: [birth year]  Age: 32 y.o. MRN: [id number]  CC: Annual Exam and Hyperlipidemia   HPI Manuel Boyer presents for a CPX.  He complains of a 1 week history of cough productive of yellow phlegm.  He denies fever, chills, night sweats, chest pain, or shortness of breath.  He also denies any new neurological symptoms.  Outpatient Medications Prior to Visit  Medication Sig Dispense Refill   loratadine (CLARITIN) 10 MG tablet Take by mouth.     ASPIRIN LOW DOSE 81 MG EC tablet TAKE 1 TABLET(81 MG) BY MOUTH DAILY 90 tablet 1   pravastatin (PRAVACHOL) 20 MG tablet Take 1 tablet (20 mg total) by mouth daily. 90 tablet 3   No facility-administered medications prior to visit.    ROS Review of Systems  Constitutional:  Negative for chills, diaphoresis, fatigue and fever.  HENT: Negative.  Negative for sore throat and trouble swallowing.   Eyes: Negative.   Respiratory:  Positive for cough. Negative for chest tightness, shortness of breath and wheezing.   Cardiovascular:  Negative for chest pain, palpitations and leg swelling.  Gastrointestinal:  Negative for abdominal pain, diarrhea, nausea and vomiting.  Endocrine: Negative.   Genitourinary: Negative.  Negative for difficulty urinating.  Musculoskeletal: Negative.   Skin: Negative.   Neurological: Negative.  Negative for dizziness and weakness.  Hematological:  Negative for adenopathy. Does not bruise/bleed easily.  Psychiatric/Behavioral: Negative.      Objective:  BP 122/76 (BP Location: Left Arm, Patient Position: Sitting, Cuff Size: Large)   Pulse 72   Temp 98.3 F (36.8 C) (Oral)   Resp 16   Ht 5' 9.6" (1.768 m)   Wt 236 lb (107 kg)   SpO2 96%   BMI 34.25 kg/m   BP Readings from Last 3 Encounters:  06/12/22 122/76  12/26/21 114/64  09/19/21 124/75    Wt Readings from Last 3 Encounters:  06/12/22 236 lb (107 kg)  12/26/21 240 lb 12.8 oz (109.2 kg)  08/02/21 242  lb 12.8 oz (110.1 kg)    Physical Exam Vitals reviewed.  Constitutional:      Appearance: He is not ill-appearing.  HENT:     Nose: Nose normal.     Mouth/Throat:     Mouth: Mucous membranes are moist.  Eyes:     General: No scleral icterus.    Conjunctiva/sclera: Conjunctivae normal.  Cardiovascular:     Rate and Rhythm: Normal rate and regular rhythm.     Heart sounds: No murmur heard. Pulmonary:     Effort: Pulmonary effort is normal.     Breath sounds: No stridor. No wheezing, rhonchi or rales.  Abdominal:     General: Abdomen is flat.     Palpations: There is no mass.     Tenderness: There is no abdominal tenderness. There is no guarding.     Hernia: No hernia is present.  Musculoskeletal:        General: Normal range of motion.     Cervical back: Neck supple.     Right lower leg: No edema.     Left lower leg: No edema.  Lymphadenopathy:     Cervical: No cervical adenopathy.  Skin:    General: Skin is warm and dry.  Neurological:     General: No focal deficit present.     Mental Status: He is alert.  Psychiatric:        Mood and Affect: Mood normal.  Behavior: Behavior normal.     Lab Results  Component Value Date   WBC 8.2 06/12/2022   HGB 14.3 06/12/2022   HCT 42.1 06/12/2022   PLT 266.0 06/12/2022   GLUCOSE 89 06/12/2022   CHOL 160 06/12/2022   TRIG 304.0 (H) 06/12/2022   HDL 29.20 (L) 06/12/2022   LDLDIRECT 104.0 06/12/2022   LDLCALC 66 03/07/2021   ALT 35 06/12/2022   AST 20 06/12/2022   NA 142 06/12/2022   K 3.9 06/12/2022   CL 106 06/12/2022   CREATININE 1.27 06/12/2022   BUN 16 06/12/2022   CO2 28 06/12/2022   TSH 0.57 06/12/2022   INR 1.00 11/28/2016   HGBA1C 6.1 06/12/2022    DG Chest 2 View  Result Date: 06/12/2022 CLINICAL DATA:  Cough EXAM: CHEST - 2 VIEW COMPARISON:  Chest x-ray 11/28/2016 FINDINGS: The heart size and mediastinal contours are within normal limits. Both lungs are clear. The visualized skeletal structures are  unremarkable. IMPRESSION: No active cardiopulmonary disease. Electronically Signed   By: Darliss Cheney M.D.   On: 06/12/2022 16:12     Assessment & Plan:   Jaydrien was seen today for annual exam and hyperlipidemia.  Diagnoses and all orders for this visit:  Subacute cough- Chest x-ray is negative for infiltrate.  This is consistent with a viral upper respiratory infection. -     DG Chest 2 View; Future -     CBC with Differential/Platelet; Future -     Hepatic function panel; Future -     Hepatic function panel -     CBC with Differential/Platelet  Hyperlipidemia with target LDL less than 100- He has not achieved his LDL goal.  Will restart the statin. -     Lipid panel; Future -     Hepatic function panel; Future -     TSH; Future -     pravastatin (PRAVACHOL) 20 MG tablet; Take 1 tablet (20 mg total) by mouth daily. -     TSH -     Hepatic function panel -     Lipid panel  Routine general medical examination at a health care facility- Exam completed, labs reviewed, vaccines reviewed, no cancer screenings indicated, patient education was given.  Moya moya disease -     pravastatin (PRAVACHOL) 20 MG tablet; Take 1 tablet (20 mg total) by mouth daily. -     aspirin EC (ASPIRIN LOW DOSE) 81 MG tablet; TAKE 1 TABLET(81 MG) BY MOUTH DAILY  Hyperglycemia- He is prediabetic. -     Hemoglobin A1c; Future -     Basic metabolic panel; Future -     Basic metabolic panel -     Hemoglobin A1c  Other orders -     LDL cholesterol, direct   I have changed Manuel Boyer's Aspirin Low Dose to aspirin EC. I am also having him maintain his loratadine and pravastatin.  Meds ordered this encounter  Medications   pravastatin (PRAVACHOL) 20 MG tablet    Sig: Take 1 tablet (20 mg total) by mouth daily.    Dispense:  90 tablet    Refill:  0   aspirin EC (ASPIRIN LOW DOSE) 81 MG tablet    Sig: TAKE 1 TABLET(81 MG) BY MOUTH DAILY    Dispense:  90 tablet    Refill:  1     Follow-up: No  follow-ups on file.  Sanda Linger, MD

## 2022-06-12 NOTE — Patient Instructions (Signed)

## 2022-12-27 ENCOUNTER — Ambulatory Visit: Payer: Commercial Managed Care - HMO | Admitting: Neurology
